# Patient Record
Sex: Male | Born: 1985 | Race: Black or African American | Hispanic: No | Marital: Single
Health system: Southern US, Community
[De-identification: ages and names within clinical notes are randomized; demographics above are authoritative.]

## PROBLEM LIST (undated history)

## (undated) DIAGNOSIS — Z201 Contact with and (suspected) exposure to tuberculosis: Secondary | ICD-10-CM

---

## 2007-08-27 DIAGNOSIS — Z201 Contact with and (suspected) exposure to tuberculosis: Secondary | ICD-10-CM

## 2007-08-27 HISTORY — DX: Contact with and (suspected) exposure to tuberculosis: Z20.1

## 2012-07-04 ENCOUNTER — Emergency Department (HOSPITAL_COMMUNITY)
Admission: EM | Admit: 2012-07-04 | Discharge: 2012-07-04 | Disposition: A | Discharge status: Home / Self Care | Payer: Self-pay | Attending: Emergency Medicine | Admitting: Emergency Medicine

## 2012-07-04 ENCOUNTER — Encounter (HOSPITAL_COMMUNITY): Payer: Self-pay | Admitting: Physical Medicine and Rehabilitation

## 2012-07-04 DIAGNOSIS — L0231 Cutaneous abscess of buttock: Secondary | ICD-10-CM | POA: Insufficient documentation

## 2012-07-04 DIAGNOSIS — L0291 Cutaneous abscess, unspecified: Secondary | ICD-10-CM

## 2012-07-04 DIAGNOSIS — L03317 Cellulitis of buttock: Secondary | ICD-10-CM | POA: Insufficient documentation

## 2012-07-04 MED ORDER — HYDROCODONE-ACETAMINOPHEN 5-500 MG PO TABS: 1.0000 | ORAL_TABLET | Freq: Four times a day (QID) | ORAL | Status: DC | PRN

## 2012-07-04 MED ORDER — SULFAMETHOXAZOLE-TRIMETHOPRIM 800-160 MG PO TABS: 1.0000 | ORAL_TABLET | Freq: Two times a day (BID) | ORAL | Status: DC

## 2012-07-04 NOTE — ED Notes (Signed)
I and D completed by MD pt tolerated well. Dressing with instructions given

## 2012-07-04 NOTE — ED Notes (Signed)
Pt presents to department for evaluation of possible abscess to R buttock. Ongoing x4 days. Red, raised area with yellow drainage noted. Pt states 8/10 pain. Denies fever. He is alert and oriented x4. No signs of distress noted.

## 2012-07-04 NOTE — ED Notes (Signed)
MD at bedside. 

## 2012-07-04 NOTE — ED Provider Notes (Addendum)
History    This chart was scribed for Gwyneth Sprout, MD, MD by Smitty Pluck, ED Scribe. The patient was seen in room [facility] and the patient's care was started at 2:18PM.   CSN: [id number]  Arrival date & time 07/04/12  1407      No chief complaint on file.   (Consider location/radiation/quality/duration/timing/severity/associated sxs/prior treatment) The history is provided by the patient. No language interpreter was used.   [name] is a 26 y.o. male who presents to the Emergency Department complaining of constant, moderate right buttocks pain due to abscess onset 4 days ago. Pt reports that he has had yellow drainage, redness and pain is rated at 8/10.Marland Kitchen He denies fever and any other pain.   No past medical history on file.  No past surgical history on file.  No family history on file.  History  Substance Use Topics  . Smoking status: Not on file  . Smokeless tobacco: Not on file  . Alcohol Use: Not on file      Review of Systems  Constitutional: Negative for fever and chills.  Respiratory: Negative for shortness of breath.   Gastrointestinal: Negative for nausea and vomiting.  Neurological: Negative for weakness.  All other systems reviewed and are negative.    Allergies  Review of patient's allergies indicates not on file.  Home Medications  No current outpatient prescriptions on file.  BP 128/74  Pulse 112  Temp 99.7 F (37.6 C) (Oral)  Resp 18  SpO2 97%  Physical Exam  Nursing note and vitals reviewed. Constitutional: He is oriented to person, place, and time. He appears well-developed and well-nourished. No distress.  HENT:  Head: Normocephalic and atraumatic.  Eyes: EOM are normal.  Neck: Neck supple. No tracheal deviation present.  Pulmonary/Chest: Effort normal. No respiratory distress.  Musculoskeletal: Normal range of motion.  Neurological: He is alert and oriented to person, place, and time.  Skin: Skin is warm and dry.   fluctuant pointing abscess on right buttocks Surrounding erythema and induration Not currently draining Warm   Psychiatric: He has a normal mood and affect. His behavior is normal.    ED Course  Procedures (including critical care time) DIAGNOSTIC STUDIES: Oxygen Saturation is 97% on room air, normal by my interpretation.    COORDINATION OF CARE: 2:20 PM Discussed ED treatment with pt     Labs Reviewed - No data to display No results found.  INCISION AND DRAINAGE Performed by: Gwyneth Sprout Consent: Verbal consent obtained. Risks and benefits: risks, benefits and alternatives were discussed Type: abscess  Body area: right buttocks  Anesthesia: local infiltration  Local anesthetic: lidocaine 2% with epinephrine  Anesthetic total: 5 ml  Complexity: complex, incision was made with 11blade scaple Blunt dissection to break up loculations  Drainage: purulent  Drainage amount: 5mL  Packing material: none  Patient tolerance: Patient tolerated the procedure well with no immediate complications.     1. Abscess       MDM   Patient with an abscess and surrounding cellulitis. I&D as above patient placed on Bactrim and discharged home.      *I personally performed the services described in this documentation, which was scribed in my presence.  The recorded information has been reviewed and considered.    Gwyneth Sprout, MD 07/04/12 1438  Gwyneth Sprout, MD 07/15/12 1326

## 2013-03-26 ENCOUNTER — Emergency Department (HOSPITAL_COMMUNITY)
Admission: EM | Admit: 2013-03-26 | Discharge: 2013-03-26 | Disposition: A | Discharge status: Home / Self Care | Payer: Self-pay | Attending: Emergency Medicine | Admitting: Emergency Medicine

## 2013-03-26 ENCOUNTER — Encounter (HOSPITAL_COMMUNITY): Payer: Self-pay | Admitting: *Deleted

## 2013-03-26 DIAGNOSIS — L0291 Cutaneous abscess, unspecified: Secondary | ICD-10-CM

## 2013-03-26 DIAGNOSIS — Z8611 Personal history of tuberculosis: Secondary | ICD-10-CM | POA: Insufficient documentation

## 2013-03-26 DIAGNOSIS — IMO0002 Reserved for concepts with insufficient information to code with codable children: Secondary | ICD-10-CM | POA: Insufficient documentation

## 2013-03-26 DIAGNOSIS — F17200 Nicotine dependence, unspecified, uncomplicated: Secondary | ICD-10-CM | POA: Insufficient documentation

## 2013-03-26 HISTORY — DX: Contact with and (suspected) exposure to tuberculosis: Z20.1

## 2013-03-26 MED ORDER — IBUPROFEN 800 MG PO TABS
800.0000 mg | ORAL_TABLET | Freq: Once | ORAL | Status: AC
  Administered 2013-03-26: 800 mg via ORAL
  Filled 2013-03-26: qty 1

## 2013-03-26 NOTE — ED Provider Notes (Signed)
CSN: [id number]     Arrival date & time 03/26/13  1027 History     First MD Initiated Contact with Patient 03/26/13 [phone]     Chief Complaint  Patient presents with  . Abscess    L arm   (Consider location/radiation/quality/duration/timing/severity/associated sxs/prior Treatment) HPI Comments: Patient is a 27 year old otherwise healthy male presenting to the ED for a abscess on his left forearm that began 4 days ago. Patient states area is moderately tender without radiation. Patient has not noticed any drainage from site, surrounding erythema or warmth. Patient states he has had abscesses in the past that required drainage. He denies any fevers, chills.   Patient is a 27 y.o. male presenting with abscess.  Abscess Associated symptoms: no fever     Past Medical History  Diagnosis Date  . TB (tuberculosis) contact 2009    skin test positive finished tx   History reviewed. No pertinent past surgical history. No family history on file. History  Substance Use Topics  . Smoking status: Current Every Day Smoker -- 0.50 packs/day    Types: Cigarettes  . Smokeless tobacco: Not on file  . Alcohol Use: Yes    Review of Systems  Constitutional: Negative for fever and chills.  Skin:       Abscess    Allergies  Review of patient's allergies indicates no known allergies.  Home Medications   Current Outpatient Rx  Name  Route  Sig  Dispense  Refill  . HYDROcodone-acetaminophen (VICODIN) 5-500 MG per tablet   Oral   Take 1-2 tablets by mouth every 6 (six) hours as needed for pain.   15 tablet   0   . ibuprofen (ADVIL,MOTRIN) 200 MG tablet   Oral   Take 600 mg by mouth every 6 (six) hours as needed. For pain         . sulfamethoxazole-trimethoprim (SEPTRA DS) 800-160 MG per tablet   Oral   Take 1 tablet by mouth every 12 (twelve) hours.   10 tablet   0    BP 137/87  Pulse 62  Temp(Src) 98.1 F (36.7 C) (Oral)  Resp 18  SpO2 99% Physical Exam  Constitutional: He  is oriented to person, place, and time. He appears well-developed and well-nourished. No distress.  HENT:  Head: Normocephalic and atraumatic.  Eyes: Conjunctivae are normal.  Neck: Neck supple.  Pulmonary/Chest: Effort normal.  Abdominal: Soft.  Musculoskeletal: Normal range of motion.  Neurological: He is alert and oriented to person, place, and time.  Skin: Skin is warm and dry. He is not diaphoretic.  2 cm fluctuant abscess on left forearm. No surrounding erythema or warmth.   Psychiatric: He has a normal mood and affect.    ED Course   Procedures (including critical care time)  INCISION AND DRAINAGE Performed by: Francee Piccolo L Consent: Verbal consent obtained. Risks and benefits: risks, benefits and alternatives were discussed Type: abscess  Body area: left forearm  Anesthesia: local infiltration  Incision was made with a scalpel.  Local anesthetic: lidocaine 2% w/o epinephrine  Anesthetic total: 4 ml  Complexity: complex Blunt dissection to break up loculations  Drainage: purulent  Drainage amount: copious  Packing material: 1/4 in iodoform gauze  Patient tolerance: Patient tolerated the procedure well with no immediate complications.     Labs Reviewed - No data to display No results found. 1. Abscess     MDM  Patient with skin abscess amenable to incision and drainage.  Abscess was packed,  wound recheck in 2 days. Encouraged home warm soaks and flushing.  No signs of cellulitis is surrounding skin.  Will d/c to home.  No antibiotic therapy is indicated. Patient is agreeable to plan. Patient is stable at time of discharge     Jeannetta Ellis, PA-C 03/26/13 1600

## 2013-03-26 NOTE — ED Notes (Signed)
Pt reports shaving his L arm x 4 days ago to have a tattoo done.  Started to notice a boil.  Pt presents with a large abscess in his L arm.  Pt reports pain in his L arm.

## 2013-03-26 NOTE — Progress Notes (Signed)
[facility] did not get to see patient but will be sending information about the [facility] Orange Card program, using the address provided.  °

## 2013-03-28 NOTE — ED Provider Notes (Signed)
Medical screening examination/treatment/procedure(s) were performed by non-physician practitioner and as supervising physician I was immediately available for consultation/collaboration.   Suzi Roots, MD 03/28/13 [phone]

## 2013-12-04 ENCOUNTER — Encounter (HOSPITAL_COMMUNITY): Payer: Self-pay | Admitting: Emergency Medicine

## 2013-12-04 ENCOUNTER — Emergency Department (HOSPITAL_COMMUNITY): Payer: Self-pay

## 2013-12-04 ENCOUNTER — Emergency Department (HOSPITAL_COMMUNITY)
Admission: EM | Admit: 2013-12-04 | Discharge: 2013-12-04 | Disposition: A | Discharge status: Home / Self Care | Payer: Self-pay | Attending: Emergency Medicine | Admitting: Emergency Medicine

## 2013-12-04 DIAGNOSIS — Z8611 Personal history of tuberculosis: Secondary | ICD-10-CM | POA: Insufficient documentation

## 2013-12-04 DIAGNOSIS — F17200 Nicotine dependence, unspecified, uncomplicated: Secondary | ICD-10-CM | POA: Insufficient documentation

## 2013-12-04 DIAGNOSIS — M549 Dorsalgia, unspecified: Secondary | ICD-10-CM | POA: Insufficient documentation

## 2013-12-04 DIAGNOSIS — B349 Viral infection, unspecified: Secondary | ICD-10-CM

## 2013-12-04 DIAGNOSIS — B9789 Other viral agents as the cause of diseases classified elsewhere: Secondary | ICD-10-CM | POA: Insufficient documentation

## 2013-12-04 DIAGNOSIS — D72829 Elevated white blood cell count, unspecified: Secondary | ICD-10-CM | POA: Insufficient documentation

## 2013-12-04 LAB — HIV ANTIBODY (ROUTINE TESTING W REFLEX): HIV 1&2 Ab, 4th Generation: NONREACTIVE

## 2013-12-04 LAB — CBC WITH DIFFERENTIAL/PLATELET
BASOS ABS: 0 10*3/uL (ref 0.0–0.1)
BASOS PCT: 0 % (ref 0–1)
EOS PCT: 0 % (ref 0–5)
Eosinophils Absolute: 0 10*3/uL (ref 0.0–0.7)
HEMATOCRIT: 45.9 % (ref 39.0–52.0)
Hemoglobin: 16.3 g/dL (ref 13.0–17.0)
Lymphocytes Relative: 7 % — ABNORMAL LOW (ref 12–46)
Lymphs Abs: 0.9 10*3/uL (ref 0.7–4.0)
MCH: 31.6 pg (ref 26.0–34.0)
MCHC: 35.5 g/dL (ref 30.0–36.0)
MCV: 89 fL (ref 78.0–100.0)
MONO ABS: 1.8 10*3/uL — AB (ref 0.1–1.0)
Monocytes Relative: 14 % — ABNORMAL HIGH (ref 3–12)
Neutro Abs: 9.8 10*3/uL — ABNORMAL HIGH (ref 1.7–7.7)
Neutrophils Relative %: 79 % — ABNORMAL HIGH (ref 43–77)
PLATELETS: 228 10*3/uL (ref 150–400)
RBC: 5.16 MIL/uL (ref 4.22–5.81)
RDW: 12.7 % (ref 11.5–15.5)
WBC: 12.5 10*3/uL — ABNORMAL HIGH (ref 4.0–10.5)

## 2013-12-04 LAB — BASIC METABOLIC PANEL
BUN: 8 mg/dL (ref 6–23)
CALCIUM: 9.6 mg/dL (ref 8.4–10.5)
CO2: 22 mEq/L (ref 19–32)
CREATININE: 0.84 mg/dL (ref 0.50–1.35)
Chloride: 100 mEq/L (ref 96–112)
Glucose, Bld: 113 mg/dL — ABNORMAL HIGH (ref 70–99)
Potassium: 4 mEq/L (ref 3.7–5.3)
Sodium: 134 mEq/L — ABNORMAL LOW (ref 137–147)

## 2013-12-04 LAB — URINALYSIS, ROUTINE W REFLEX MICROSCOPIC
Bilirubin Urine: NEGATIVE
Glucose, UA: NEGATIVE mg/dL
Hgb urine dipstick: NEGATIVE
KETONES UR: 15 mg/dL — AB
LEUKOCYTES UA: NEGATIVE
NITRITE: NEGATIVE
PROTEIN: 30 mg/dL — AB
Specific Gravity, Urine: 1.03 (ref 1.005–1.030)
UROBILINOGEN UA: 1 mg/dL (ref 0.0–1.0)
pH: 6 (ref 5.0–8.0)

## 2013-12-04 LAB — URINE MICROSCOPIC-ADD ON

## 2013-12-04 LAB — RPR

## 2013-12-04 MED ORDER — PROMETHAZINE HCL 25 MG PO TABS: 25.0000 mg | ORAL_TABLET | Freq: Four times a day (QID) | ORAL | Status: DC | PRN

## 2013-12-04 MED ORDER — ONDANSETRON HCL 4 MG/2ML IJ SOLN
4.0000 mg | Freq: Once | INTRAMUSCULAR | Status: AC
Start: 2013-12-04 — End: 2013-12-04
  Administered 2013-12-04: 4 mg via INTRAVENOUS
  Filled 2013-12-04: qty 2

## 2013-12-04 MED ORDER — SODIUM CHLORIDE 0.9 % IV BOLUS (SEPSIS): 1000.0000 mL | Freq: Once | INTRAVENOUS | Status: DC

## 2013-12-04 MED ORDER — METRONIDAZOLE 500 MG PO TABS
2000.0000 mg | ORAL_TABLET | Freq: Once | ORAL | Status: AC
  Administered 2013-12-04: 2000 mg via ORAL
  Filled 2013-12-04: qty 4

## 2013-12-04 MED ORDER — ACETAMINOPHEN 500 MG PO TABS
1000.0000 mg | ORAL_TABLET | Freq: Once | ORAL | Status: AC
  Administered 2013-12-04: 1000 mg via ORAL
  Filled 2013-12-04: qty 2

## 2013-12-04 MED ORDER — HYDROCODONE-ACETAMINOPHEN 5-325 MG PO TABS: 1.0000 | ORAL_TABLET | ORAL | Status: DC | PRN

## 2013-12-04 MED ORDER — IBUPROFEN 800 MG PO TABS: 800.0000 mg | ORAL_TABLET | Freq: Three times a day (TID) | ORAL | Status: DC | PRN

## 2013-12-04 MED ORDER — KETOROLAC TROMETHAMINE 30 MG/ML IJ SOLN
30.0000 mg | Freq: Once | INTRAMUSCULAR | Status: AC
  Administered 2013-12-04: 30 mg via INTRAVENOUS
  Filled 2013-12-04: qty 1

## 2013-12-04 MED ORDER — SODIUM CHLORIDE 0.9 % IV BOLUS (SEPSIS)
1000.0000 mL | Freq: Once | INTRAVENOUS | Status: AC
  Administered 2013-12-04: 1000 mL via INTRAVENOUS

## 2013-12-04 NOTE — ED Notes (Signed)
He tells me he has had generalized myalgias, plus some n/v/d; x 2-3 days.

## 2013-12-04 NOTE — ED Notes (Signed)
Pt now stating wants STD check b/c male partner has trich.

## 2013-12-04 NOTE — Discharge Instructions (Signed)
Viral Infections A viral infection can be caused by different types of viruses.Most viral infections are not serious and resolve on their own. However, some infections may cause severe symptoms and may lead to further complications. SYMPTOMS Viruses can frequently cause:  Minor sore throat.  Aches and pains.  Headaches.  Runny nose.  Different types of rashes.  Watery eyes.  Tiredness.  Cough.  Loss of appetite.  Gastrointestinal infections, resulting in nausea, vomiting, and diarrhea. These symptoms do not respond to antibiotics because the infection is not caused by bacteria. However, you might catch a bacterial infection following the viral infection. This is sometimes called a "superinfection." Symptoms of such a bacterial infection may include:  Worsening sore throat with pus and difficulty swallowing.  Swollen neck glands.  Chills and a high or persistent fever.  Severe headache.  Tenderness over the sinuses.  Persistent overall ill feeling (malaise), muscle aches, and tiredness (fatigue).  Persistent cough.  Yellow, green, or brown mucus production with coughing. HOME CARE INSTRUCTIONS   Only take over-the-counter or prescription medicines for pain, discomfort, diarrhea, or fever as directed by your caregiver.  Drink enough water and fluids to keep your urine clear or pale yellow. Sports drinks can provide valuable electrolytes, sugars, and hydration.  Get plenty of rest and maintain proper nutrition. Soups and broths with crackers or rice are fine. SEEK IMMEDIATE MEDICAL CARE IF:   You have severe headaches, shortness of breath, chest pain, neck pain, or an unusual rash.  You have uncontrolled vomiting, diarrhea, or you are unable to keep down fluids.  You or your child has an oral temperature above 102 F (38.9 C), not controlled by medicine.  Your baby is older than 3 months with a rectal temperature of 102 F (38.9 C) or higher.  Your baby is 13  months old or younger with a rectal temperature of 100.4 F (38 C) or higher. MAKE SURE YOU:   Understand these instructions.  Will watch your condition.  Will get help right away if you are not doing well or get worse. Document Released: 05/22/2005 Document Revised: 11/04/2011 Document Reviewed: 12/17/2010 [facility] Patient Information 2014 [place], [place]. Possible Influenza, Adult Influenza ("the flu") is a viral infection of the respiratory tract. It occurs more often in winter months because people spend more time in close contact with one another. Influenza can make you feel very sick. Influenza easily spreads from person to person (contagious). CAUSES  Influenza is caused by a virus that infects the respiratory tract. You can catch the virus by breathing in droplets from an infected person's cough or sneeze. You can also catch the virus by touching something that was recently contaminated with the virus and then touching your mouth, nose, or eyes. SYMPTOMS  Symptoms typically last 4 to 10 days and may include: Fever. Chills. Headache, body aches, and muscle aches. Sore throat. Chest discomfort and cough. Poor appetite. Weakness or feeling tired. Dizziness. Nausea or vomiting. DIAGNOSIS  Diagnosis of influenza is often made based on your history and a physical exam. A nose or throat swab test can be done to confirm the diagnosis. RISKS AND COMPLICATIONS You may be at risk for a more severe case of influenza if you smoke cigarettes, have diabetes, have chronic heart disease (such as heart failure) or lung disease (such as asthma), or if you have a weakened immune system. Elderly people and pregnant women are also at risk for more serious infections. The most common complication of influenza is a  lung infection (pneumonia). Sometimes, this complication can require emergency medical care and may be life-threatening. PREVENTION  An annual influenza vaccination (flu shot) is the best  way to avoid getting influenza. An annual flu shot is now routinely recommended for all adults in the [place] TREATMENT  In mild cases, influenza goes away on its own. Treatment is directed at relieving symptoms. For more severe cases, your caregiver may prescribe antiviral medicines to shorten the sickness. Antibiotic medicines are not effective, because the infection is caused by a virus, not by bacteria. HOME CARE INSTRUCTIONS Only take over-the-counter or prescription medicines for pain, discomfort, or fever as directed by your caregiver. Use a cool mist humidifier to make breathing easier. Get plenty of rest until your temperature returns to normal. This usually takes 3 to 4 days. Drink enough fluids to keep your urine clear or pale yellow. Cover your mouth and nose when coughing or sneezing, and wash your hands well to avoid spreading the virus. Stay home from work or school until your fever has been gone for at least 1 full day. SEEK MEDICAL CARE IF:  You have chest pain or a deep cough that worsens or produces more mucus. You have nausea, vomiting, or diarrhea. SEEK IMMEDIATE MEDICAL CARE IF:  You have difficulty breathing, shortness of breath, or your skin or nails turn bluish. You have severe neck pain or stiffness. You have a severe headache, facial pain, or earache. You have a worsening or recurring fever. You have nausea or vomiting that cannot be controlled. MAKE SURE YOU: Understand these instructions. Will watch your condition. Will get help right away if you are not doing well or get worse. Document Released: 08/09/2000 Document Revised: 02/11/2012 Document Reviewed: 11/11/2011 [facility] Patient Information 2014 [place], [place].

## 2013-12-04 NOTE — ED Provider Notes (Signed)
TIME SEEN: 11:50 AM  CHIEF COMPLAINT: Fever, body aches, cough, congestion, STD exposure  HPI: Patient is a 28 year old male with no significant past medical history who presents emergency department with 3-4 days of fevers, chills, body aches, headache, cough, congestion, diarrhea, vomiting. He states he is also concerned because his girlfriend was recently tested for STDs and was positive for trichomonas. He denies dysuria, hematuria, penile discharge, testicular pain or swelling. No sick contacts or recent travel or hospitalization. He is not immunocompromised. No neck pain or neck stiffness. No chest pain or shortness of breath. No abdominal pain. No rash.  ROS: See HPI Constitutional: no fever  Eyes: no drainage  ENT: no runny nose   Cardiovascular:  no chest pain  Resp: no SOB  GI: no vomiting GU: no dysuria Integumentary: no rash  Allergy: no hives  Musculoskeletal: no leg swelling  Neurological: no slurred speech ROS otherwise negative  PAST MEDICAL HISTORY/PAST SURGICAL HISTORY:  Past Medical History  Diagnosis Date  . TB (tuberculosis) contact 2009    skin test positive finished tx    MEDICATIONS:  Prior to Admission medications   Not on File    ALLERGIES:  No Known Allergies  SOCIAL HISTORY:  History  Substance Use Topics  . Smoking status: Current Every Day Smoker -- 0.50 packs/day    Types: Cigarettes  . Smokeless tobacco: Not on file  . Alcohol Use: Yes    FAMILY HISTORY: History reviewed. No pertinent family history.  EXAM: BP 141/79  Pulse 91  Temp(Src) 100.3 F (37.9 C) (Oral)  Resp 16  SpO2 95% CONSTITUTIONAL: Alert and oriented and responds appropriately to questions. Appears uncomfortable but is nontoxic, well-hydrated HEAD: Normocephalic EYES: Conjunctivae clear, PERRL ENT: normal nose; no rhinorrhea; moist mucous membranes; pharynx without lesions noted NECK: Supple, no meningismus, no LAD  CARD: RRR; S1 and S2 appreciated; no murmurs,  no clicks, no rubs, no gallops RESP: Normal chest excursion without splinting or tachypnea; breath sounds clear and equal bilaterally; no wheezes, no rhonchi, no rales, patient is frequently coughing ABD/GI: Normal bowel sounds; non-distended; soft, non-tender, no rebound, no guarding BACK:  The back appears normal and is non-tender to palpation, there is no CVA tenderness EXT: Normal ROM in all joints; non-tender to palpation; no edema; normal capillary refill; no cyanosis    SKIN: Normal color for age and race; warm NEURO: Moves all extremities equally, strength 5/5 upper extremity, normal gait, sensation to light touch intact diffusely, 2+ deep tendon reflexes in bilateral upper lower extremity is. PSYCH: The patient's mood and manner are appropriate. Grooming and personal hygiene are appropriate.  MEDICAL DECISION MAKING: Patient likely with viral illness.  He is well-appearing with a reassuring exam. Will give IV fluids, Zofran, Toradol. We'll check basic labs, urine. Will also check for STDs.  ED PROGRESS: Mild leukocytosis with left shift. His urine shows no sign of infection, no trichomonas but given his recent exposure, will treat with Flagyl. Chest x-ray is negative. STD screening is pending. He has been able to tolerate by mouth without any further vomiting. We'll discharge him with return precautions and supportive care instructions.  Patient is asking for something stronger for pain as he is also having some mild back pain. On exam he has no midline spinal tenderness or step-off or deformity, no complaints of numbness, tingling incontinence. His neurologic exam is normal. Doubt transverse myelitis, cauda equina, discitis, epidural abscess.     Layla MawKristen N Sherryann Frese, DO 12/04/13 1409

## 2013-12-04 NOTE — ED Notes (Signed)
Pt states fever, chills, body aches and headache x 3-4 days.  Unable to check fever at home.  Vomiting x 1

## 2013-12-04 NOTE — ED Notes (Signed)
Pt in X ray

## 2015-04-30 IMAGING — CR DG CHEST 2V
2 series · 2 of 2 positions shown · non-contrast
Comparison: None.

CLINICAL DATA: Chest pain, cough, fever, history of smoking,
evaluate for pneumonia

EXAM:
CHEST  2 VIEW

[w chest pa]
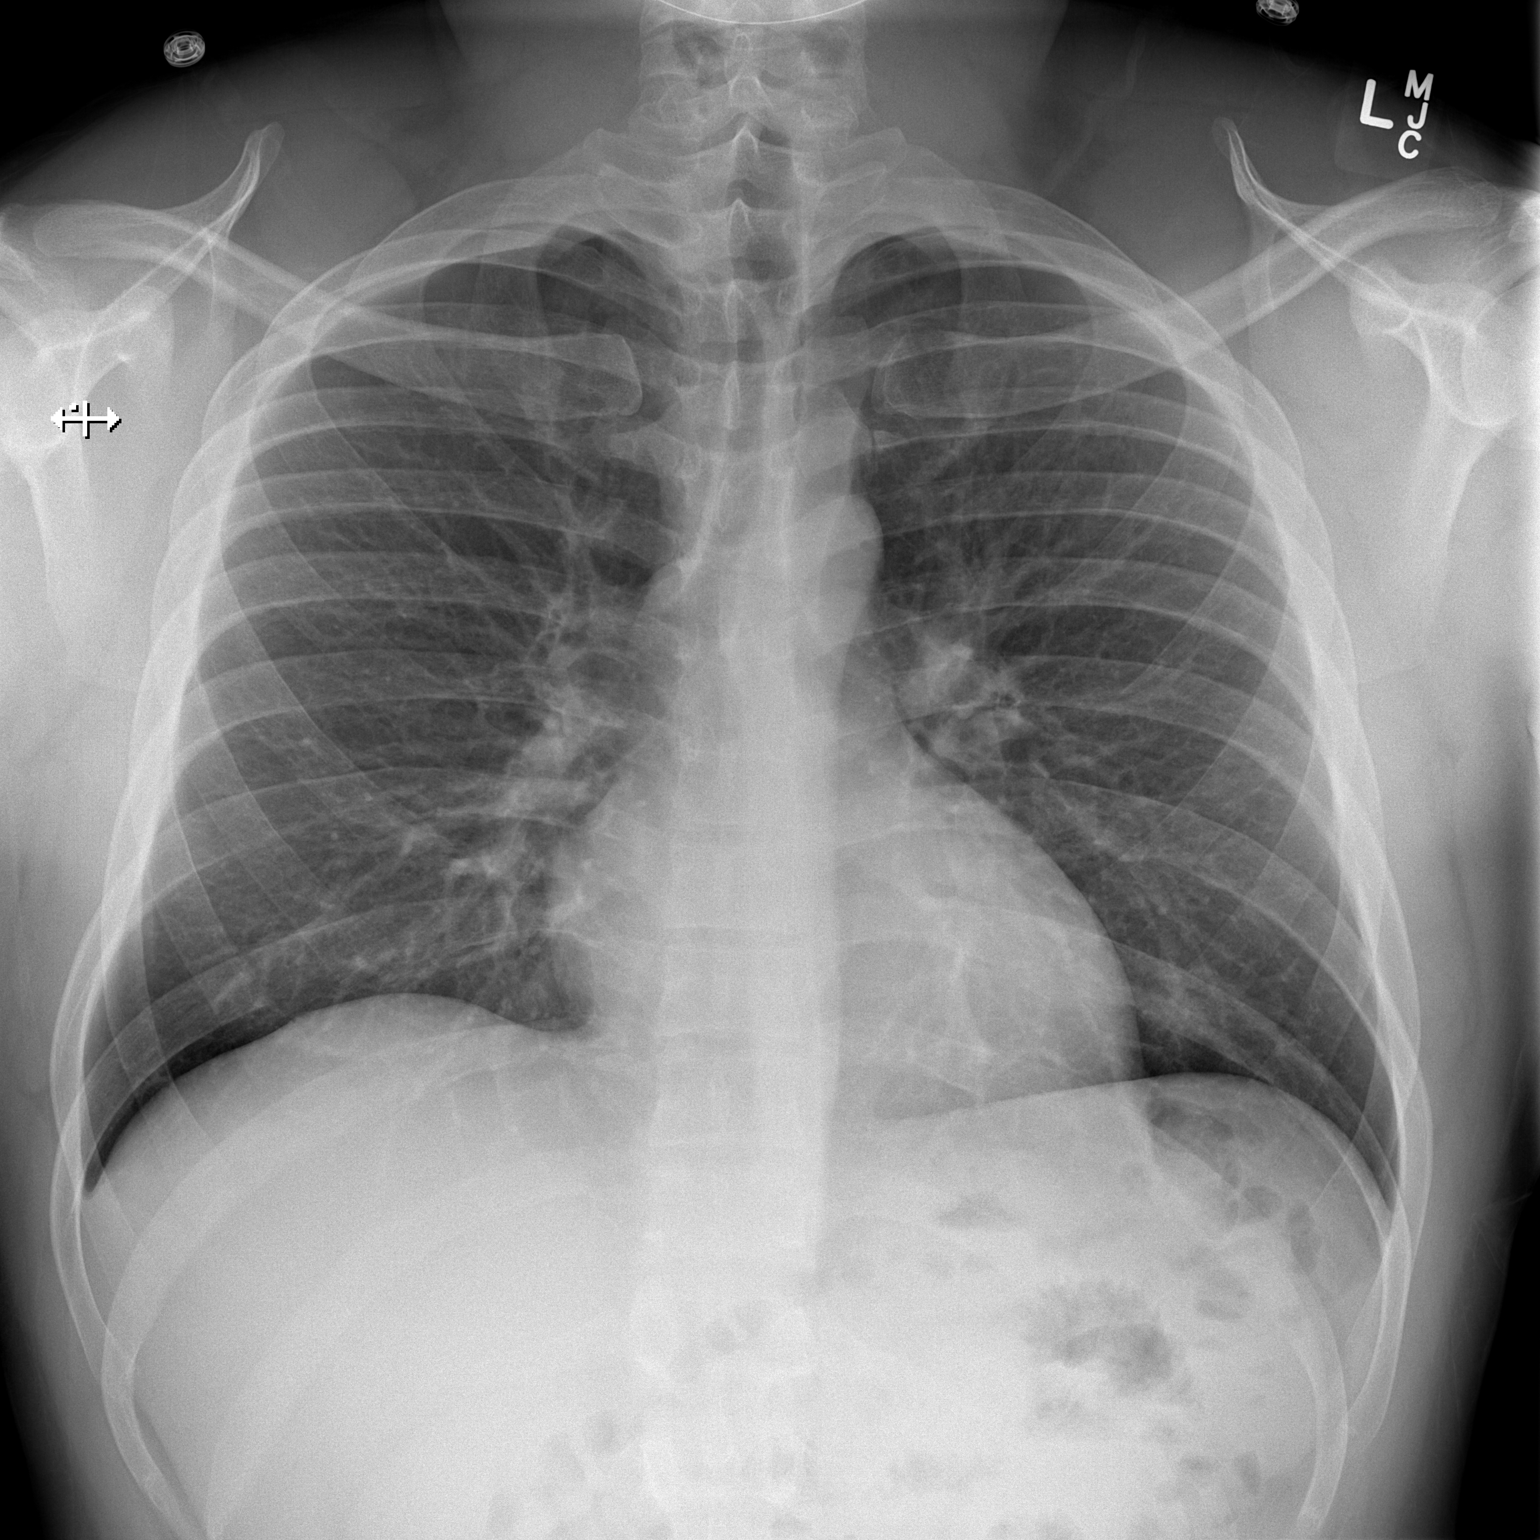

[w chest lat]
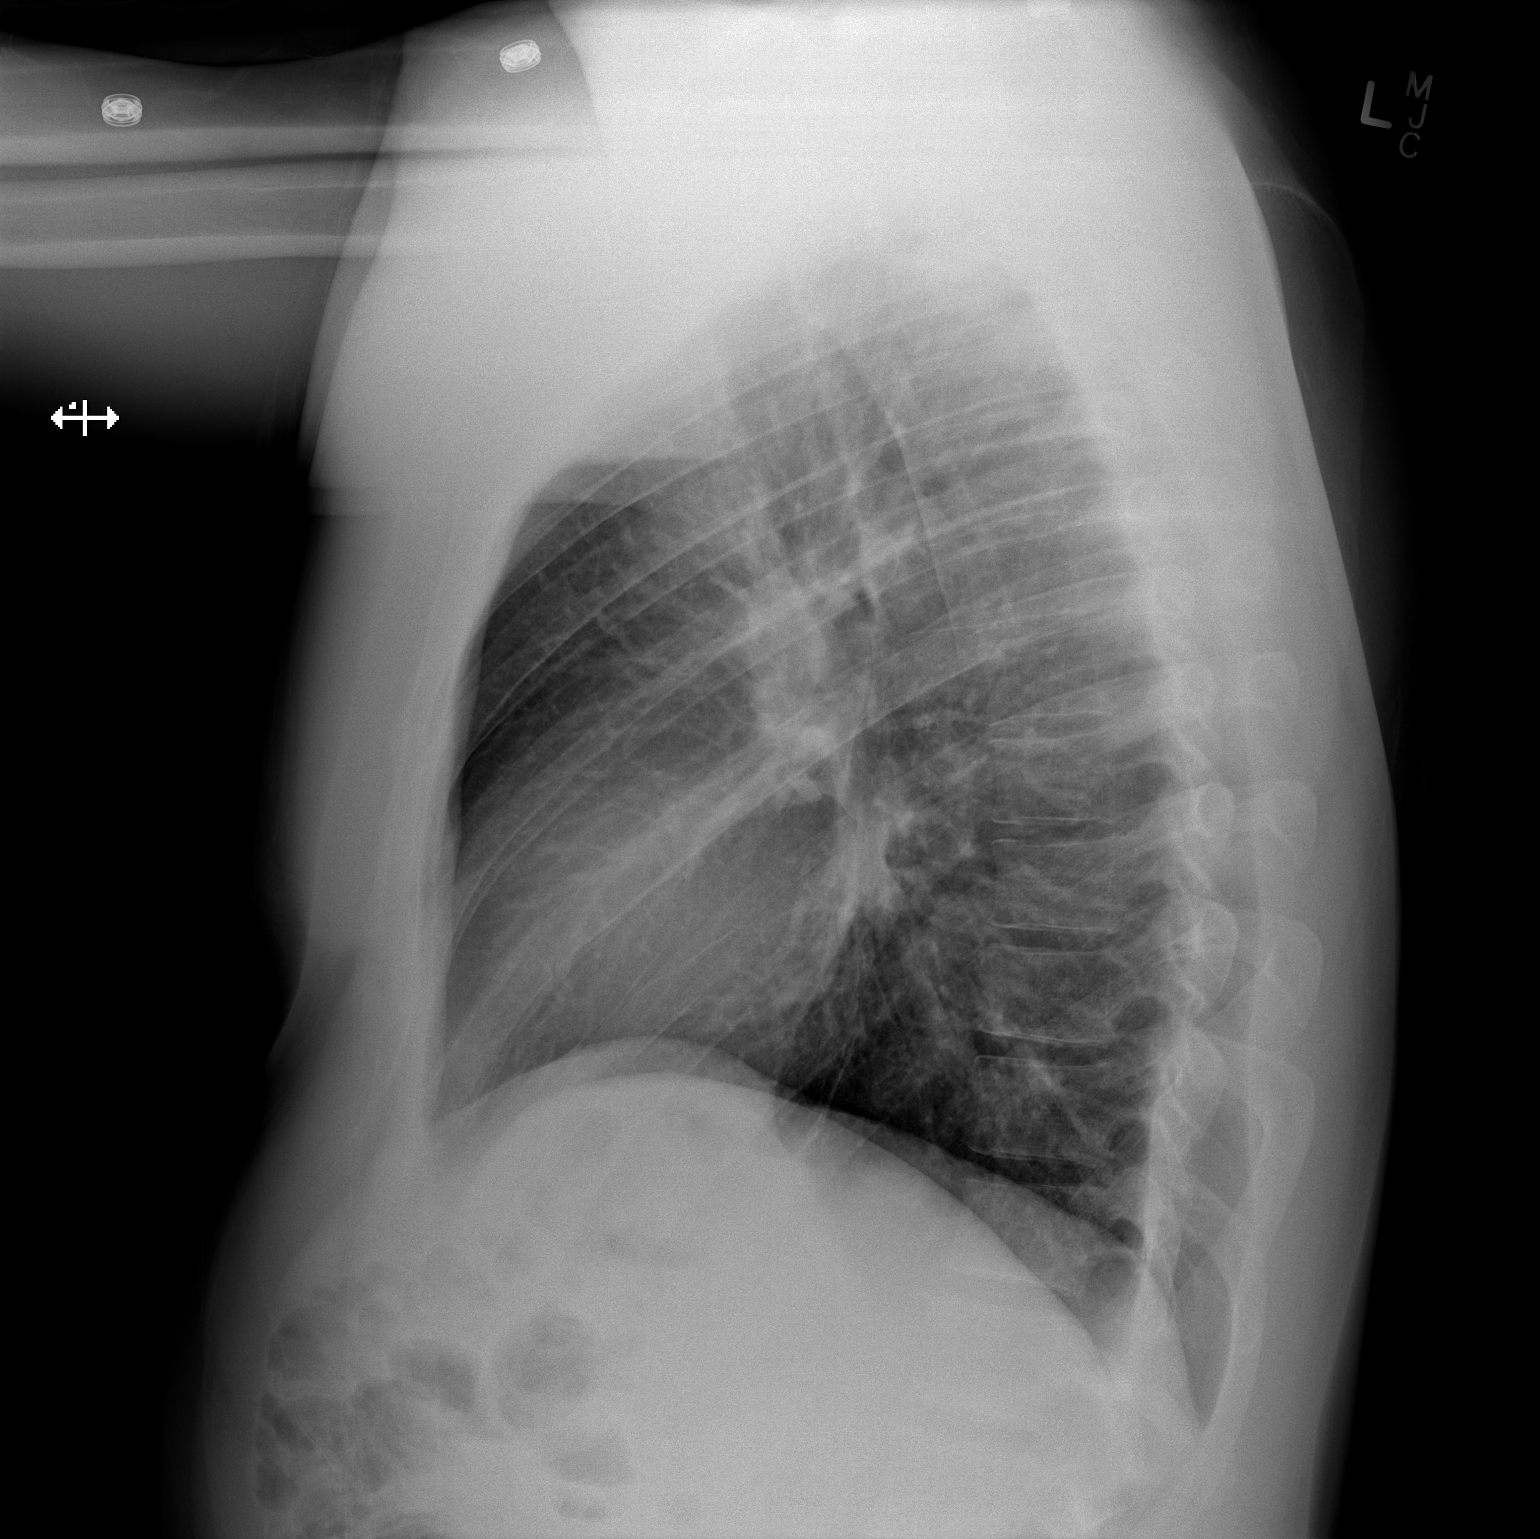

[2 of 2 positions shown; findings below may reference images not displayed]

FINDINGS: Normal cardiac silhouette and mediastinal contours. The lungs are
mildly hyperexpanded. No focal airspace opacities. No pleural
effusion or pneumothorax. No evidence of edema. No acute osseus
abnormalities.
IMPRESSION: Mild lung hyperexpansion without acute cardiopulmonary disease.
Specifically, no evidence of pneumonia.

## 2016-03-01 ENCOUNTER — Encounter (HOSPITAL_COMMUNITY): Payer: Self-pay | Admitting: Emergency Medicine

## 2016-03-01 ENCOUNTER — Emergency Department (HOSPITAL_COMMUNITY)
Admission: EM | Admit: 2016-03-01 | Discharge: 2016-03-01 | Disposition: A | Discharge status: Home / Self Care | Payer: Self-pay | Attending: Emergency Medicine | Admitting: Emergency Medicine

## 2016-03-01 DIAGNOSIS — L259 Unspecified contact dermatitis, unspecified cause: Secondary | ICD-10-CM

## 2016-03-01 DIAGNOSIS — F17210 Nicotine dependence, cigarettes, uncomplicated: Secondary | ICD-10-CM | POA: Insufficient documentation

## 2016-03-01 DIAGNOSIS — L255 Unspecified contact dermatitis due to plants, except food: Secondary | ICD-10-CM | POA: Insufficient documentation

## 2016-03-01 MED ORDER — PREDNISONE 20 MG PO TABS
60.0000 mg | ORAL_TABLET | Freq: Once | ORAL | Status: AC
  Administered 2016-03-01: 60 mg via ORAL
  Filled 2016-03-01: qty 3

## 2016-03-01 MED ORDER — PREDNISONE 10 MG PO TABS: ORAL_TABLET | ORAL | Status: AC

## 2016-03-01 NOTE — ED Provider Notes (Signed)
CSN: [id number]     Arrival date & time 03/01/16  [id number] History   First MD Initiated Contact with Patient 03/01/16 0820     Chief Complaint  Patient presents with  . Rash     (Consider location/radiation/quality/duration/timing/severity/associated sxs/prior Treatment) HPI [name] is a 30 y.o. male with no medical problems presents to emergency department with a rash. Patient states rash started approximately 3 weeks ago when he started a new job. Patient works in Aeronautical engineerlandscaping. He states rashes to bilateral hands, forearms, upper arms, neck. Denies any rash to any other parts of the body. No rash involving oral mucosa. No fever or chills. He states he had similar rash in the past when his entire family was diagnosed with scabies. He states this time however no one in family has had similar symptoms. He states he tried permethrin cream which did not help. He tried applying calamine lotion, taking Benadryl, doing a mild valgus, states none of these things have helped. No other associated symptoms.   Past Medical History  Diagnosis Date  . TB (tuberculosis) contact 2009    skin test positive finished tx   History reviewed. No pertinent past surgical history. No family history on file. Social History  Substance Use Topics  . Smoking status: Current Every Day Smoker -- 0.50 packs/day    Types: Cigarettes  . Smokeless tobacco: None  . Alcohol Use: Yes    Review of Systems  Constitutional: Negative for fever and chills.  Respiratory: Negative for cough, chest tightness and shortness of breath.   Cardiovascular: Negative for chest pain, palpitations and leg swelling.  Gastrointestinal: Negative for nausea, vomiting, abdominal pain, diarrhea and abdominal distention.  Genitourinary: Negative for dysuria, urgency, frequency and hematuria.  Musculoskeletal: Negative for myalgias, arthralgias, neck pain and neck stiffness.  Skin: Positive for rash.  Allergic/Immunologic: Negative for  immunocompromised state.  Neurological: Negative for dizziness, weakness, light-headedness, numbness and headaches.  All other systems reviewed and are negative.     Allergies  Review of patient's allergies indicates no known allergies.  Home Medications   Prior to Admission medications   Medication Sig Start Date End Date Taking? Authorizing Provider  HYDROcodone-acetaminophen (NORCO/VICODIN) 5-325 MG per tablet Take 1 tablet by mouth every 4 (four) hours as needed. 12/04/13   Kristen N Ward, DO  ibuprofen (ADVIL,MOTRIN) 800 MG tablet Take 1 tablet (800 mg total) by mouth every 8 (eight) hours as needed for mild pain. 12/04/13   Kristen N Ward, DO  promethazine (PHENERGAN) 25 MG tablet Take 1 tablet (25 mg total) by mouth every 6 (six) hours as needed for nausea or vomiting. 12/04/13   Kristen N Ward, DO   BP 140/74 mmHg  Pulse 65  Temp(Src) 98.6 F (37 C) (Oral)  Resp 18  SpO2 98% Physical Exam  Constitutional: He is oriented to person, place, and time. He appears well-developed and well-nourished. No distress.  HENT:  Head: Normocephalic and atraumatic.  No oral mucosal lesions  Eyes: Conjunctivae are normal.  Neck: Neck supple.  Cardiovascular: Normal rate, regular rhythm and normal heart sounds.   Pulmonary/Chest: Effort normal and breath sounds normal. No respiratory distress. He has no wheezes. He has no rales.  Musculoskeletal: He exhibits no edema.  Neurological: He is alert and oriented to person, place, and time.  Skin: Skin is warm and dry.  Erythematous papular rash to bilateral hands, forearms, upper arms, neck. No vesicular lesions. No drainage. Excoriations noted.  Nursing note and vitals reviewed.  ED Course  Procedures (including critical care time) Labs Review Labs Reviewed - No data to display  Imaging Review No results found. I have personally reviewed and evaluated these images and lab results as part of my medical decision-making.   EKG  Interpretation None      MDM   Final diagnoses:  Contact dermatitis    Pt with rash to bilateral arms and neck, since starting a landscaping job. Has tried benadryl, topical over the counter lotions with no relief. Will try prednisone taper. VS normal. No oral mucosal lesions. No SOB. No concern for scabies. Afebrile.   Filed Vitals:   03/01/16 0820  BP: 140/74  Pulse: 65  Temp: 98.6 F (37 C)  TempSrc: Oral  Resp: 18  SpO2: 98%       Jaynie Crumbleatyana Hollan Philipp, PA-C 03/01/16 0857  Linwood DibblesJon Knapp, MD 03/01/16 [phone]

## 2016-03-01 NOTE — ED Notes (Signed)
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 No itching anywhere else.

## 2016-03-01 NOTE — Discharge Instructions (Signed)
Take prednisone as prescribed until all gone. Next dose tomorrow. Follow up with your doctor. Wear protective clothing when working.    Contact Dermatitis Dermatitis is redness, soreness, and swelling (inflammation) of the skin. Contact dermatitis is a reaction to certain substances that touch the skin. There are two types of contact dermatitis:   Irritant contact dermatitis. This type is caused by something that irritates your skin, such as dry hands from washing them too much. This type does not require previous exposure to the substance for a reaction to occur. This type is more common.  Allergic contact dermatitis. This type is caused by a substance that you are allergic to, such as a nickel allergy or poison ivy. This type only occurs if you have been exposed to the substance (allergen) before. Upon a repeat exposure, your body reacts to the substance. This type is less common. CAUSES  Many different substances can cause contact dermatitis. Irritant contact dermatitis is most commonly caused by exposure to:   Makeup.   Soaps.   Detergents.   Bleaches.   Acids.   Metal salts, such as nickel.  Allergic contact dermatitis is most commonly caused by exposure to:   Poisonous plants.   Chemicals.   Jewelry.   Latex.   Medicines.   Preservatives in products, such as clothing.  RISK FACTORS This condition is more likely to develop in:   People who have jobs that expose them to irritants or allergens.  People who have certain medical conditions, such as asthma or eczema.  SYMPTOMS  Symptoms of this condition may occur anywhere on your body where the irritant has touched you or is touched by you. Symptoms include:  Dryness or flaking.   Redness.   Cracks.   Itching.   Pain or a burning feeling.   Blisters.  Drainage of small amounts of blood or clear fluid from skin cracks. With allergic contact dermatitis, there may also be swelling in areas  such as the eyelids, mouth, or genitals.  DIAGNOSIS  This condition is diagnosed with a medical history and physical exam. A patch skin test may be performed to help determine the cause. If the condition is related to your job, you may need to see an occupational medicine specialist. TREATMENT Treatment for this condition includes figuring out what caused the reaction and protecting your skin from further contact. Treatment may also include:   Steroid creams or ointments. Oral steroid medicines may be needed in more severe cases.  Antibiotics or antibacterial ointments, if a skin infection is present.  Antihistamine lotion or an antihistamine taken by mouth to ease itching.  A bandage (dressing). HOME CARE INSTRUCTIONS Skin Care  Moisturize your skin as needed.   Apply cool compresses to the affected areas.  Try taking a bath with:  Epsom salts. Follow the instructions on the packaging. You can get these at your local pharmacy or grocery store.  Baking soda. Pour a small amount into the bath as directed by your health care provider.  Colloidal oatmeal. Follow the instructions on the packaging. You can get this at your local pharmacy or grocery store.  Try applying baking soda paste to your skin. Stir water into baking soda until it reaches a paste-like consistency.  Do not scratch your skin.  Bathe less frequently, such as every other day.  Bathe in lukewarm water. Avoid using hot water. Medicines  Take or apply over-the-counter and prescription medicines only as told by your health care provider.   If  you were prescribed an antibiotic medicine, take or apply your antibiotic as told by your health care provider. Do not stop using the antibiotic even if your condition starts to improve. General Instructions  Keep all follow-up visits as told by your health care provider. This is important.  Avoid the substance that caused your reaction. If you do not know what caused  it, keep a journal to try to track what caused it. Write down:  What you eat.  What cosmetic products you use.  What you drink.  What you wear in the affected area. This includes jewelry.  If you were given a dressing, take care of it as told by your health care provider. This includes when to change and remove it. SEEK MEDICAL CARE IF:   Your condition does not improve with treatment.  Your condition gets worse.  You have signs of infection such as swelling, tenderness, redness, soreness, or warmth in the affected area.  You have a fever.  You have new symptoms. SEEK IMMEDIATE MEDICAL CARE IF:   You have a severe headache, neck pain, or neck stiffness.  You vomit.  You feel very sleepy.  You notice red streaks coming from the affected area.  Your bone or joint underneath the affected area becomes painful after the skin has healed.  The affected area turns darker.  You have difficulty breathing.   This information is not intended to replace advice given to you by your health care provider. Make sure you discuss any questions you have with your health care provider.   Document Released: 08/09/2000 Document Revised: 05/03/2015 Document Reviewed: 12/28/2014 Elsevier Interactive Patient Education [employer]2016 Elsevier Inc.

## 2019-11-16 HISTORY — PX: EXPLORATORY LAPAROTOMY: SUR591

## 2019-11-17 ENCOUNTER — Emergency Department (HOSPITAL_COMMUNITY): Payer: 59

## 2019-11-17 ENCOUNTER — Encounter (HOSPITAL_COMMUNITY): Payer: Self-pay | Admitting: *Deleted

## 2019-11-17 ENCOUNTER — Emergency Department (HOSPITAL_COMMUNITY): Payer: 59 | Admitting: Anesthesiology

## 2019-11-17 ENCOUNTER — Inpatient Hospital Stay (HOSPITAL_COMMUNITY): Payer: 59

## 2019-11-17 ENCOUNTER — Inpatient Hospital Stay (HOSPITAL_COMMUNITY)
Admission: EM | Admit: 2019-11-17 | Discharge: 2019-11-21 | DRG: 957 | Disposition: A | Discharge status: Home / Self Care | Payer: 59 | Attending: Surgery | Admitting: Surgery

## 2019-11-17 ENCOUNTER — Encounter (HOSPITAL_COMMUNITY): Admission: EM | Disposition: A | Discharge status: Home / Self Care | Payer: Self-pay | Source: Home / Self Care

## 2019-11-17 DIAGNOSIS — W3400XA Accidental discharge from unspecified firearms or gun, initial encounter: Secondary | ICD-10-CM

## 2019-11-17 DIAGNOSIS — F1010 Alcohol abuse, uncomplicated: Secondary | ICD-10-CM | POA: Diagnosis present

## 2019-11-17 DIAGNOSIS — Z20822 Contact with and (suspected) exposure to covid-19: Secondary | ICD-10-CM | POA: Diagnosis present

## 2019-11-17 DIAGNOSIS — S36898A Other injury of other intra-abdominal organs, initial encounter: Secondary | ICD-10-CM | POA: Diagnosis present

## 2019-11-17 DIAGNOSIS — S36892A Contusion of other intra-abdominal organs, initial encounter: Secondary | ICD-10-CM | POA: Diagnosis present

## 2019-11-17 DIAGNOSIS — S31633A Puncture wound without foreign body of abdominal wall, right lower quadrant with penetration into peritoneal cavity, initial encounter: Secondary | ICD-10-CM | POA: Diagnosis present

## 2019-11-17 DIAGNOSIS — S32302A Unspecified fracture of left ilium, initial encounter for closed fracture: Secondary | ICD-10-CM | POA: Diagnosis present

## 2019-11-17 DIAGNOSIS — S31139A Puncture wound of abdominal wall without foreign body, unspecified quadrant without penetration into peritoneal cavity, initial encounter: Secondary | ICD-10-CM

## 2019-11-17 DIAGNOSIS — F1290 Cannabis use, unspecified, uncomplicated: Secondary | ICD-10-CM | POA: Diagnosis present

## 2019-11-17 DIAGNOSIS — S31634A Puncture wound without foreign body of abdominal wall, left lower quadrant with penetration into peritoneal cavity, initial encounter: Secondary | ICD-10-CM | POA: Diagnosis present

## 2019-11-17 DIAGNOSIS — F17200 Nicotine dependence, unspecified, uncomplicated: Secondary | ICD-10-CM | POA: Diagnosis present

## 2019-11-17 DIAGNOSIS — K658 Other peritonitis: Secondary | ICD-10-CM | POA: Diagnosis present

## 2019-11-17 DIAGNOSIS — Z4659 Encounter for fitting and adjustment of other gastrointestinal appliance and device: Secondary | ICD-10-CM

## 2019-11-17 DIAGNOSIS — E871 Hypo-osmolality and hyponatremia: Secondary | ICD-10-CM | POA: Diagnosis present

## 2019-11-17 DIAGNOSIS — Y92512 Supermarket, store or market as the place of occurrence of the external cause: Secondary | ICD-10-CM | POA: Diagnosis not present

## 2019-11-17 DIAGNOSIS — S36498A Other injury of other part of small intestine, initial encounter: Secondary | ICD-10-CM | POA: Diagnosis present

## 2019-11-17 HISTORY — PX: LAPAROTOMY: SHX154

## 2019-11-17 LAB — COMPREHENSIVE METABOLIC PANEL
ALT: 29 U/L (ref 0–44)
AST: 29 U/L (ref 15–41)
Albumin: 4 g/dL (ref 3.5–5.0)
Alkaline Phosphatase: 61 U/L (ref 38–126)
Anion gap: 12 (ref 5–15)
BUN: 10 mg/dL (ref 6–20)
CO2: 20 mmol/L — ABNORMAL LOW (ref 22–32)
Calcium: 9.1 mg/dL (ref 8.9–10.3)
Chloride: 109 mmol/L (ref 98–111)
Creatinine, Ser: 1.03 mg/dL (ref 0.61–1.24)
GFR calc Af Amer: >60 mL/min (ref >60)
GFR calc non Af Amer: >60 mL/min (ref >60)
Glucose, Bld: 127 mg/dL — ABNORMAL HIGH (ref 70–99)
Potassium: 3.4 mmol/L — ABNORMAL LOW (ref 3.5–5.1)
Sodium: 141 mmol/L (ref 135–145)
Total Bilirubin: 0.5 mg/dL (ref 0.3–1.2)
Total Protein: 7.2 g/dL (ref 6.5–8.1)

## 2019-11-17 LAB — LACTIC ACID, PLASMA: Lactic Acid, Venous: 2.9 mmol/L (ref 0.5–1.9)

## 2019-11-17 LAB — I-STAT CHEM 8, ED
BUN: 11 mg/dL (ref 6–20)
Calcium, Ion: 1.16 mmol/L (ref 1.15–1.40)
Chloride: 107 mmol/L (ref 98–111)
Creatinine, Ser: 1 mg/dL (ref 0.61–1.24)
Glucose, Bld: 120 mg/dL — ABNORMAL HIGH (ref 70–99)
HCT: 47 % (ref 39.0–52.0)
Hemoglobin: 16 g/dL (ref 13.0–17.0)
Potassium: 3.2 mmol/L — ABNORMAL LOW (ref 3.5–5.1)
Sodium: 141 mmol/L (ref 135–145)
TCO2: 21 mmol/L — ABNORMAL LOW (ref 22–32)

## 2019-11-17 LAB — RESPIRATORY PANEL BY RT PCR (FLU A&B, COVID)
Influenza A by PCR: NEGATIVE
Influenza B by PCR: NEGATIVE
SARS Coronavirus 2 by RT PCR: NEGATIVE

## 2019-11-17 LAB — CBC
HCT: 42.4 % (ref 39.0–52.0)
HCT: 45.9 % (ref 39.0–52.0)
Hemoglobin: 14.7 g/dL (ref 13.0–17.0)
Hemoglobin: 15.9 g/dL (ref 13.0–17.0)
MCH: 31 pg (ref 26.0–34.0)
MCH: 31.1 pg (ref 26.0–34.0)
MCHC: 34.6 g/dL (ref 30.0–36.0)
MCHC: 34.7 g/dL (ref 30.0–36.0)
MCV: 89.5 fL (ref 80.0–100.0)
MCV: 89.8 fL (ref 80.0–100.0)
Platelets: 280 10*3/uL (ref 150–400)
Platelets: 321 10*3/uL (ref 150–400)
RBC: 4.72 MIL/uL (ref 4.22–5.81)
RBC: 5.13 MIL/uL (ref 4.22–5.81)
RDW: 12.8 % (ref 11.5–15.5)
RDW: 12.8 % (ref 11.5–15.5)
WBC: 13.3 10*3/uL — ABNORMAL HIGH (ref 4.0–10.5)
WBC: 17.9 10*3/uL — ABNORMAL HIGH (ref 4.0–10.5)
nRBC: 0 % (ref 0.0–0.2)
nRBC: 0 % (ref 0.0–0.2)

## 2019-11-17 LAB — PROTIME-INR
INR: 1 (ref 0.8–1.2)
Prothrombin Time: 12.8 seconds (ref 11.4–15.2)

## 2019-11-17 LAB — ETHANOL: Alcohol, Ethyl (B): 107 mg/dL — ABNORMAL HIGH (ref <10)

## 2019-11-17 LAB — SAMPLE TO BLOOD BANK

## 2019-11-17 LAB — MAGNESIUM: Magnesium: 1.4 mg/dL — ABNORMAL LOW (ref 1.7–2.4)

## 2019-11-17 SURGERY — LAPAROTOMY, EXPLORATORY
Anesthesia: General | Site: Abdomen

## 2019-11-17 MED ORDER — ONDANSETRON HCL 4 MG/2ML IJ SOLN
INTRAMUSCULAR | Status: AC
  Filled 2019-11-17: qty 2

## 2019-11-17 MED ORDER — ONDANSETRON HCL 4 MG/2ML IJ SOLN
4.0000 mg | Freq: Four times a day (QID) | INTRAMUSCULAR | Status: DC | PRN
  Administered 2019-11-17 (×2): 4 mg via INTRAVENOUS
  Filled 2019-11-17 (×2): qty 2

## 2019-11-17 MED ORDER — PHENYLEPHRINE 40 MCG/ML (10ML) SYRINGE FOR IV PUSH (FOR BLOOD PRESSURE SUPPORT)
PREFILLED_SYRINGE | INTRAVENOUS | Status: AC
  Filled 2019-11-17: qty 10

## 2019-11-17 MED ORDER — PHENYLEPHRINE HCL (PRESSORS) 10 MG/ML IV SOLN
INTRAVENOUS | Status: DC | PRN
  Administered 2019-11-17 (×2): 120 ug via INTRAVENOUS

## 2019-11-17 MED ORDER — METOPROLOL TARTRATE 5 MG/5ML IV SOLN: 5.0000 mg | Freq: Four times a day (QID) | INTRAVENOUS | Status: DC | PRN

## 2019-11-17 MED ORDER — ARTIFICIAL TEARS OPHTHALMIC OINT
TOPICAL_OINTMENT | OPHTHALMIC | Status: AC
  Filled 2019-11-17: qty 3.5

## 2019-11-17 MED ORDER — PROPOFOL 10 MG/ML IV BOLUS
INTRAVENOUS | Status: DC | PRN
  Administered 2019-11-17: 180 mg via INTRAVENOUS

## 2019-11-17 MED ORDER — LACTATED RINGERS IV SOLN: INTRAVENOUS | Status: DC | PRN

## 2019-11-17 MED ORDER — ROCURONIUM BROMIDE 10 MG/ML (PF) SYRINGE
PREFILLED_SYRINGE | INTRAVENOUS | Status: AC
  Filled 2019-11-17: qty 20

## 2019-11-17 MED ORDER — FENTANYL CITRATE (PF) 250 MCG/5ML IJ SOLN
INTRAMUSCULAR | Status: DC | PRN
  Administered 2019-11-17: 100 ug via INTRAVENOUS
  Administered 2019-11-17: 50 ug via INTRAVENOUS
  Administered 2019-11-17 (×2): 100 ug via INTRAVENOUS
  Administered 2019-11-17: 150 ug via INTRAVENOUS

## 2019-11-17 MED ORDER — PROMETHAZINE HCL 25 MG/ML IJ SOLN: 6.2500 mg | INTRAMUSCULAR | Status: DC | PRN

## 2019-11-17 MED ORDER — 0.9 % SODIUM CHLORIDE (POUR BTL) OPTIME
TOPICAL | Status: DC | PRN
  Administered 2019-11-17: 7000 mL

## 2019-11-17 MED ORDER — PROPOFOL 10 MG/ML IV BOLUS
INTRAVENOUS | Status: AC
  Filled 2019-11-17: qty 40

## 2019-11-17 MED ORDER — DIPHENHYDRAMINE HCL 50 MG/ML IJ SOLN: 25.0000 mg | Freq: Four times a day (QID) | INTRAMUSCULAR | Status: DC | PRN

## 2019-11-17 MED ORDER — PIPERACILLIN-TAZOBACTAM 3.375 G IVPB
3.3750 g | Freq: Three times a day (TID) | INTRAVENOUS | Status: DC
  Administered 2019-11-17 – 2019-11-21 (×13): 3.375 g via INTRAVENOUS
  Filled 2019-11-17 (×14): qty 50

## 2019-11-17 MED ORDER — MIDAZOLAM HCL 2 MG/2ML IJ SOLN
INTRAMUSCULAR | Status: DC | PRN
  Administered 2019-11-17: 2 mg via INTRAVENOUS

## 2019-11-17 MED ORDER — METHOCARBAMOL 1000 MG/10ML IJ SOLN
500.0000 mg | Freq: Four times a day (QID) | INTRAVENOUS | Status: DC
  Administered 2019-11-17 – 2019-11-19 (×9): 500 mg via INTRAVENOUS
  Filled 2019-11-17: qty 500
  Filled 2019-11-17 (×7): qty 5
  Filled 2019-11-17: qty 500
  Filled 2019-11-17 (×2): qty 5
  Filled 2019-11-17: qty 500

## 2019-11-17 MED ORDER — ACETAMINOPHEN 10 MG/ML IV SOLN
1000.0000 mg | Freq: Four times a day (QID) | INTRAVENOUS | Status: AC
  Administered 2019-11-17 – 2019-11-18 (×4): 1000 mg via INTRAVENOUS
  Filled 2019-11-17 (×4): qty 100

## 2019-11-17 MED ORDER — MIDAZOLAM HCL 2 MG/2ML IJ SOLN
INTRAMUSCULAR | Status: AC
  Filled 2019-11-17: qty 2

## 2019-11-17 MED ORDER — LACTATED RINGERS IV SOLN
INTRAVENOUS | Status: AC | PRN
  Administered 2019-11-17: 1000 mL via INTRAVENOUS

## 2019-11-17 MED ORDER — TETANUS-DIPHTH-ACELL PERTUSSIS 5-2.5-18.5 LF-MCG/0.5 IM SUSP: 0.5000 mL | Freq: Once | INTRAMUSCULAR | Status: DC

## 2019-11-17 MED ORDER — MENTHOL 3 MG MT LOZG
1.0000 | LOZENGE | OROMUCOSAL | Status: DC | PRN
  Filled 2019-11-17: qty 9

## 2019-11-17 MED ORDER — DIPHENHYDRAMINE HCL 25 MG PO CAPS: 25.0000 mg | ORAL_CAPSULE | Freq: Four times a day (QID) | ORAL | Status: DC | PRN

## 2019-11-17 MED ORDER — SODIUM CHLORIDE 0.9 % IV SOLN: INTRAVENOUS | Status: DC

## 2019-11-17 MED ORDER — SUCCINYLCHOLINE CHLORIDE 20 MG/ML IJ SOLN
INTRAMUSCULAR | Status: DC | PRN
  Administered 2019-11-17: 120 mg via INTRAVENOUS

## 2019-11-17 MED ORDER — DIPHENHYDRAMINE HCL 50 MG/ML IJ SOLN
INTRAMUSCULAR | Status: DC | PRN
  Administered 2019-11-17: 25 mg via INTRAVENOUS

## 2019-11-17 MED ORDER — ROCURONIUM BROMIDE 100 MG/10ML IV SOLN
INTRAVENOUS | Status: DC | PRN
  Administered 2019-11-17: 20 mg via INTRAVENOUS
  Administered 2019-11-17: 100 mg via INTRAVENOUS

## 2019-11-17 MED ORDER — GABAPENTIN 300 MG PO CAPS: 300.0000 mg | ORAL_CAPSULE | Freq: Two times a day (BID) | ORAL | Status: DC

## 2019-11-17 MED ORDER — ALBUMIN HUMAN 5 % IV SOLN: INTRAVENOUS | Status: DC | PRN

## 2019-11-17 MED ORDER — FENTANYL CITRATE (PF) 100 MCG/2ML IJ SOLN: 25.0000 ug | INTRAMUSCULAR | Status: DC | PRN

## 2019-11-17 MED ORDER — ONDANSETRON HCL 4 MG/2ML IJ SOLN
INTRAMUSCULAR | Status: DC | PRN
  Administered 2019-11-17: 4 mg via INTRAVENOUS

## 2019-11-17 MED ORDER — FENTANYL CITRATE (PF) 250 MCG/5ML IJ SOLN
INTRAMUSCULAR | Status: AC
  Filled 2019-11-17: qty 5

## 2019-11-17 MED ORDER — KCL IN DEXTROSE-NACL 20-5-0.45 MEQ/L-%-% IV SOLN
INTRAVENOUS | Status: DC
  Filled 2019-11-17 (×3): qty 1000

## 2019-11-17 MED ORDER — METHOCARBAMOL 500 MG PO TABS: 500.0000 mg | ORAL_TABLET | Freq: Four times a day (QID) | ORAL | Status: DC | PRN

## 2019-11-17 MED ORDER — OXYCODONE HCL 5 MG PO TABS: 5.0000 mg | ORAL_TABLET | Freq: Four times a day (QID) | ORAL | Status: DC | PRN

## 2019-11-17 MED ORDER — SUCCINYLCHOLINE CHLORIDE 200 MG/10ML IV SOSY
PREFILLED_SYRINGE | INTRAVENOUS | Status: AC
  Filled 2019-11-17: qty 10

## 2019-11-17 MED ORDER — KETOROLAC TROMETHAMINE 30 MG/ML IJ SOLN
30.0000 mg | Freq: Three times a day (TID) | INTRAMUSCULAR | Status: AC
  Administered 2019-11-17 – 2019-11-19 (×9): 30 mg via INTRAVENOUS
  Filled 2019-11-17 (×9): qty 1

## 2019-11-17 MED ORDER — HYDRALAZINE HCL 20 MG/ML IJ SOLN: 10.0000 mg | INTRAMUSCULAR | Status: DC | PRN

## 2019-11-17 MED ORDER — ONDANSETRON 4 MG PO TBDP: 4.0000 mg | ORAL_TABLET | Freq: Four times a day (QID) | ORAL | Status: DC | PRN

## 2019-11-17 MED ORDER — MORPHINE SULFATE (PF) 4 MG/ML IV SOLN
INTRAVENOUS | Status: AC
  Filled 2019-11-17: qty 1

## 2019-11-17 MED ORDER — MORPHINE SULFATE 2 MG/ML IJ SOLN
INTRAMUSCULAR | Status: AC | PRN
  Administered 2019-11-17: 4 mg via INTRAVENOUS

## 2019-11-17 MED ORDER — CEFAZOLIN SODIUM-DEXTROSE 2-4 GM/100ML-% IV SOLN: 2.0000 g | INTRAVENOUS | Status: DC

## 2019-11-17 MED ORDER — SUGAMMADEX SODIUM 200 MG/2ML IV SOLN
INTRAVENOUS | Status: DC | PRN
  Administered 2019-11-17: 300 mg via INTRAVENOUS

## 2019-11-17 MED ORDER — ACETAMINOPHEN 500 MG PO TABS: 1000.0000 mg | ORAL_TABLET | Freq: Four times a day (QID) | ORAL | Status: DC

## 2019-11-17 MED ORDER — EPHEDRINE 5 MG/ML INJ
INTRAVENOUS | Status: AC
  Filled 2019-11-17: qty 10

## 2019-11-17 MED ORDER — DIPHENHYDRAMINE HCL 50 MG/ML IJ SOLN
INTRAMUSCULAR | Status: AC
  Filled 2019-11-17: qty 1

## 2019-11-17 MED ORDER — SUGAMMADEX SODIUM 500 MG/5ML IV SOLN
INTRAVENOUS | Status: AC
  Filled 2019-11-17: qty 5

## 2019-11-17 MED ORDER — SODIUM CHLORIDE (PF) 0.9 % IJ SOLN
INTRAMUSCULAR | Status: AC
  Filled 2019-11-17: qty 10

## 2019-11-17 MED ORDER — POTASSIUM CHLORIDE 2 MEQ/ML IV SOLN: INTRAVENOUS | Status: DC

## 2019-11-17 MED ORDER — MAGNESIUM SULFATE 4 GM/100ML IV SOLN
4.0000 g | Freq: Once | INTRAVENOUS | Status: AC
  Administered 2019-11-17: 4 g via INTRAVENOUS
  Filled 2019-11-17: qty 100

## 2019-11-17 MED ORDER — PHENOL 1.4 % MT LIQD
1.0000 | OROMUCOSAL | Status: DC | PRN
  Administered 2019-11-17: 1 via OROMUCOSAL
  Filled 2019-11-17: qty 177

## 2019-11-17 MED ORDER — LIDOCAINE HCL (CARDIAC) PF 100 MG/5ML IV SOSY
PREFILLED_SYRINGE | INTRAVENOUS | Status: DC | PRN
  Administered 2019-11-17: 100 mg via INTRATRACHEAL

## 2019-11-17 MED ORDER — ENOXAPARIN SODIUM 40 MG/0.4ML ~~LOC~~ SOLN
40.0000 mg | SUBCUTANEOUS | Status: DC
  Administered 2019-11-18 – 2019-11-21 (×4): 40 mg via SUBCUTANEOUS
  Filled 2019-11-17 (×4): qty 0.4

## 2019-11-17 MED ORDER — HYDROMORPHONE HCL 1 MG/ML IJ SOLN
0.5000 mg | INTRAMUSCULAR | Status: DC | PRN
  Administered 2019-11-17 – 2019-11-19 (×4): 1 mg via INTRAVENOUS
  Filled 2019-11-17 (×5): qty 1

## 2019-11-17 MED ORDER — LIDOCAINE 2% (20 MG/ML) 5 ML SYRINGE
INTRAMUSCULAR | Status: AC
  Filled 2019-11-17: qty 5

## 2019-11-17 MED ORDER — CEFAZOLIN SODIUM-DEXTROSE 2-3 GM-%(50ML) IV SOLR
INTRAVENOUS | Status: DC | PRN
  Administered 2019-11-17: 2 g via INTRAVENOUS

## 2019-11-17 MED ORDER — HYDROMORPHONE HCL 1 MG/ML IJ SOLN
0.5000 mg | INTRAMUSCULAR | Status: DC | PRN
  Administered 2019-11-17: 1 mg via INTRAVENOUS
  Filled 2019-11-17: qty 1

## 2019-11-17 MED ORDER — IOHEXOL 300 MG/ML  SOLN
100.0000 mL | Freq: Once | INTRAMUSCULAR | Status: AC | PRN
  Administered 2019-11-17: 100 mL via INTRAVENOUS

## 2019-11-17 MED ORDER — PANTOPRAZOLE SODIUM 40 MG IV SOLR
40.0000 mg | Freq: Every day | INTRAVENOUS | Status: DC
  Administered 2019-11-17 – 2019-11-20 (×4): 40 mg via INTRAVENOUS
  Filled 2019-11-17 (×4): qty 40

## 2019-11-17 MED ORDER — CEFAZOLIN SODIUM 1 G IJ SOLR
INTRAMUSCULAR | Status: AC
  Filled 2019-11-17: qty 20

## 2019-11-17 SURGICAL SUPPLY — 51 items
BLADE CLIPPER SURG (BLADE) ×2 IMPLANT
BNDG GAUZE ELAST 4 BULKY (GAUZE/BANDAGES/DRESSINGS) ×2 IMPLANT
CANISTER SUCT 3000ML PPV (MISCELLANEOUS) ×3 IMPLANT
CHLORAPREP W/TINT 26 (MISCELLANEOUS) ×1 IMPLANT
COVER SURGICAL LIGHT HANDLE (MISCELLANEOUS) ×3 IMPLANT
COVER WAND RF STERILE (DRAPES) ×1 IMPLANT
DRAPE LAPAROSCOPIC ABDOMINAL (DRAPES) ×3 IMPLANT
DRAPE WARM FLUID 44X44 (DRAPES) ×1 IMPLANT
DRSG OPSITE POSTOP 4X10 (GAUZE/BANDAGES/DRESSINGS) IMPLANT
DRSG OPSITE POSTOP 4X8 (GAUZE/BANDAGES/DRESSINGS) IMPLANT
ELECT BLADE 4.0 EZ CLEAN MEGAD (MISCELLANEOUS) ×3
ELECT BLADE 6.5 EXT (BLADE) ×2 IMPLANT
ELECT CAUTERY BLADE 6.4 (BLADE) ×3 IMPLANT
ELECT REM PT RETURN 9FT ADLT (ELECTROSURGICAL) ×3
ELECTRODE BLDE 4.0 EZ CLN MEGD (MISCELLANEOUS) IMPLANT
ELECTRODE REM PT RTRN 9FT ADLT (ELECTROSURGICAL) ×1 IMPLANT
GAUZE SPONGE 4X4 12PLY STRL (GAUZE/BANDAGES/DRESSINGS) ×2 IMPLANT
GLOVE BIO SURGEON STRL SZ 6 (GLOVE) ×3 IMPLANT
GLOVE INDICATOR 6.5 STRL GRN (GLOVE) ×3 IMPLANT
GOWN STRL REUS W/ TWL LRG LVL3 (GOWN DISPOSABLE) ×2 IMPLANT
GOWN STRL REUS W/TWL LRG LVL3 (GOWN DISPOSABLE) ×4
HANDLE SUCTION POOLE (INSTRUMENTS) ×1 IMPLANT
KIT BASIN OR (CUSTOM PROCEDURE TRAY) ×3 IMPLANT
KIT TURNOVER KIT B (KITS) ×3 IMPLANT
LIGASURE IMPACT 36 18CM CVD LR (INSTRUMENTS) ×2 IMPLANT
NS IRRIG 1000ML POUR BTL (IV SOLUTION) ×6 IMPLANT
PACK GENERAL/GYN (CUSTOM PROCEDURE TRAY) ×3 IMPLANT
PAD ABD 8X10 STRL (GAUZE/BANDAGES/DRESSINGS) ×2 IMPLANT
PAD ARMBOARD 7.5X6 YLW CONV (MISCELLANEOUS) ×3 IMPLANT
PENCIL SMOKE EVACUATOR (MISCELLANEOUS) ×3 IMPLANT
RELOAD PROXIMATE 75MM BLUE (ENDOMECHANICALS) ×21 IMPLANT
RELOAD PROXIMATE TA60MM BLUE (ENDOMECHANICALS) ×3 IMPLANT
RELOAD STAPLE 60 BLU REG PROX (ENDOMECHANICALS) IMPLANT
RELOAD STAPLE 75 3.8 BLU REG (ENDOMECHANICALS) IMPLANT
SPECIMEN JAR LARGE (MISCELLANEOUS) IMPLANT
SPONGE LAP 18X18 RF (DISPOSABLE) ×4 IMPLANT
STAPLER GUN LINEAR PROX 60 (STAPLE) ×2 IMPLANT
STAPLER PROXIMATE 75MM BLUE (STAPLE) ×2 IMPLANT
STAPLER VISISTAT 35W (STAPLE) ×3 IMPLANT
SUCTION POOLE HANDLE (INSTRUMENTS) ×3
SUT PDS AB 1 TP1 96 (SUTURE) ×6 IMPLANT
SUT SILK 2 0 SH CR/8 (SUTURE) ×3 IMPLANT
SUT SILK 2 0 TIES 10X30 (SUTURE) ×3 IMPLANT
SUT SILK 3 0 SH CR/8 (SUTURE) ×5 IMPLANT
SUT SILK 3 0 TIES 10X30 (SUTURE) ×3 IMPLANT
SUT VIC AB 3-0 SH 18 (SUTURE) IMPLANT
SUT VIC AB 3-0 SH 27 (SUTURE) ×4
SUT VIC AB 3-0 SH 27X BRD (SUTURE) IMPLANT
TAPE CLOTH SURG 6X10 WHT LF (GAUZE/BANDAGES/DRESSINGS) ×2 IMPLANT
TOWEL GREEN STERILE (TOWEL DISPOSABLE) ×3 IMPLANT
TRAY FOLEY MTR SLVR 16FR STAT (SET/KITS/TRAYS/PACK) ×2 IMPLANT
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
## 2019-11-17 NOTE — ED Notes (Signed)
Transported to OR by nurse
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
## 2019-11-17 NOTE — Anesthesia Postprocedure Evaluation (Signed)
Anesthesia Post Note  Patient: Pharmacologist  Procedure(s) Performed: EXPLORATORY LAPAROTOMY SMALL BOWEL RESECTION TIMES TWO, SIGMOID RESECTION (N/A Abdomen)     Anesthesia Post Evaluation  Last Vitals:  Vitals:   11/17/19 0500 11/17/19 0525  BP: 127/68 (!) 142/92  Pulse: (!) 103   Resp: (!) 24 20  Temp: 36.4 C 36.5 C  SpO2: 93% 98%    Last Pain:  Vitals:   11/17/19 0525  TempSrc: Axillary  PainSc:                  Cecile Hearing
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
## 2019-11-17 NOTE — Transfer of Care (Signed)
Immediate Anesthesia Transfer of Care Note  Patient: [name]  Procedure(s) Performed: EXPLORATORY LAPAROTOMY SMALL BOWEL RESECTION TIMES TWO, SIGMOID RESECTION (N/A Abdomen)  Patient Location: PACU  Anesthesia Type:General  Level of Consciousness: sedated  Airway & Oxygen Therapy: Patient Spontanous Breathing and Patient connected to face mask oxygen  Post-op Assessment: Report given to RN and Post -op Vital signs reviewed and stable  Post vital signs: Reviewed and stable  Last Vitals:  Vitals Value Taken Time  BP 142/92 11/17/19 0400  Temp    Pulse 107 11/17/19 0402  Resp 25 11/17/19 0402  SpO2 98 % 11/17/19 0402  Vitals shown include unvalidated device data.  Last Pain:  Vitals:   11/17/19 0017  TempSrc:   PainSc: 10-Worst pain ever         Complications: No apparent anesthesia complications

## 2019-11-17 NOTE — Progress Notes (Signed)
Lab called to report crit lab value lactic acid 2.9 from ED.
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
## 2019-11-17 NOTE — ED Notes (Signed)
[employer] is requesting a call if patient declines/becomes critical so that they can elevate their investigation. Please call the Watch Commander/Communications @ [phone]

## 2019-11-17 NOTE — Anesthesia Procedure Notes (Signed)
Procedure Name: Intubation Date/Time: 11/17/2019 1:34 AM Performed by: Claudina Lick, CRNA Pre-anesthesia Checklist: Patient identified, Emergency Drugs available, Suction available, Patient being monitored and Timeout performed Patient Re-evaluated:Patient Re-evaluated prior to induction Oxygen Delivery Method: Circle system utilized Preoxygenation: Pre-oxygenation with 100% oxygen Induction Type: IV induction, Rapid sequence and Cricoid Pressure applied Laryngoscope Size: Miller and 2 Grade View: Grade I Tube type: Oral Tube size: 7.5 mm Number of attempts: 1 Airway Equipment and Method: Stylet Placement Confirmation: ETT inserted through vocal cords under direct vision,  positive ETCO2 and breath sounds checked- equal and bilateral Secured at: 23 cm Tube secured with: Tape Dental Injury: Teeth and Oropharynx as per pre-operative assessment
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
## 2019-11-18 LAB — BASIC METABOLIC PANEL
Anion gap: 9 (ref 5–15)
BUN: 7 mg/dL (ref 6–20)
CO2: 23 mmol/L (ref 22–32)
Calcium: 8.5 mg/dL — ABNORMAL LOW (ref 8.9–10.3)
Chloride: 100 mmol/L (ref 98–111)
Creatinine, Ser: 0.73 mg/dL (ref 0.61–1.24)
GFR calc Af Amer: >60 mL/min (ref >60)
GFR calc non Af Amer: >60 mL/min (ref >60)
Glucose, Bld: 162 mg/dL — ABNORMAL HIGH (ref 70–99)
Potassium: 3.7 mmol/L (ref 3.5–5.1)
Sodium: 132 mmol/L — ABNORMAL LOW (ref 135–145)

## 2019-11-18 LAB — CBC
HCT: 36.9 % — ABNORMAL LOW (ref 39.0–52.0)
Hemoglobin: 12.7 g/dL — ABNORMAL LOW (ref 13.0–17.0)
MCH: 31.2 pg (ref 26.0–34.0)
MCHC: 34.4 g/dL (ref 30.0–36.0)
MCV: 90.7 fL (ref 80.0–100.0)
Platelets: 227 10*3/uL (ref 150–400)
RBC: 4.07 MIL/uL — ABNORMAL LOW (ref 4.22–5.81)
RDW: 12.8 % (ref 11.5–15.5)
WBC: 12.6 10*3/uL — ABNORMAL HIGH (ref 4.0–10.5)
nRBC: 0 % (ref 0.0–0.2)

## 2019-11-18 LAB — SURGICAL PATHOLOGY

## 2019-11-18 MED ORDER — SODIUM CHLORIDE 0.9 % IV SOLN: INTRAVENOUS | Status: DC

## 2019-11-18 MED ORDER — ACETAMINOPHEN 500 MG PO TABS
1000.0000 mg | ORAL_TABLET | Freq: Four times a day (QID) | ORAL | Status: DC
  Administered 2019-11-18 – 2019-11-21 (×11): 1000 mg via ORAL
  Filled 2019-11-18 (×12): qty 2

## 2019-11-18 MED ORDER — POTASSIUM CHLORIDE IN NACL 20-0.9 MEQ/L-% IV SOLN
INTRAVENOUS | Status: DC
  Filled 2019-11-18 (×4): qty 1000

## 2019-11-18 NOTE — Plan of Care (Signed)
  Problem: Health Behavior/Discharge Planning: Goal: Ability to manage health-related needs will improve Outcome: Progressing   Problem: Clinical Measurements: Goal: Respiratory complications will improve Outcome: Progressing   Problem: Coping: Goal: Level of anxiety will decrease Outcome: Not Progressing
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
## 2019-11-19 LAB — CBC
HCT: 32.9 % — ABNORMAL LOW (ref 39.0–52.0)
Hemoglobin: 11.3 g/dL — ABNORMAL LOW (ref 13.0–17.0)
MCH: 31.2 pg (ref 26.0–34.0)
MCHC: 34.3 g/dL (ref 30.0–36.0)
MCV: 90.9 fL (ref 80.0–100.0)
Platelets: 244 10*3/uL (ref 150–400)
RBC: 3.62 MIL/uL — ABNORMAL LOW (ref 4.22–5.81)
RDW: 12.6 % (ref 11.5–15.5)
WBC: 12.8 10*3/uL — ABNORMAL HIGH (ref 4.0–10.5)
nRBC: 0 % (ref 0.0–0.2)

## 2019-11-19 LAB — BASIC METABOLIC PANEL
Anion gap: 7 (ref 5–15)
BUN: 8 mg/dL (ref 6–20)
CO2: 23 mmol/L (ref 22–32)
Calcium: 8.7 mg/dL — ABNORMAL LOW (ref 8.9–10.3)
Chloride: 106 mmol/L (ref 98–111)
Creatinine, Ser: 0.85 mg/dL (ref 0.61–1.24)
GFR calc Af Amer: >60 mL/min (ref >60)
GFR calc non Af Amer: >60 mL/min (ref >60)
Glucose, Bld: 106 mg/dL — ABNORMAL HIGH (ref 70–99)
Potassium: 4.2 mmol/L (ref 3.5–5.1)
Sodium: 136 mmol/L (ref 135–145)

## 2019-11-19 MED ORDER — OXYCODONE HCL 5 MG PO TABS
5.0000 mg | ORAL_TABLET | ORAL | Status: DC | PRN
  Administered 2019-11-19: 18:00:00 10 mg via ORAL
  Administered 2019-11-20: 5 mg via ORAL
  Administered 2019-11-20 – 2019-11-21 (×2): 10 mg via ORAL
  Administered 2019-11-21: 5 mg via ORAL
  Filled 2019-11-19: qty 2
  Filled 2019-11-19: qty 1
  Filled 2019-11-19: qty 2
  Filled 2019-11-19: qty 1
  Filled 2019-11-19: qty 2

## 2019-11-19 MED ORDER — HYDROMORPHONE HCL 1 MG/ML IJ SOLN: 0.5000 mg | INTRAMUSCULAR | Status: DC | PRN

## 2019-11-19 MED ORDER — METHOCARBAMOL 500 MG PO TABS: 500.0000 mg | ORAL_TABLET | Freq: Four times a day (QID) | ORAL | Status: DC | PRN
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
## 2019-11-20 LAB — CBC
HCT: 33.3 % — ABNORMAL LOW (ref 39.0–52.0)
Hemoglobin: 11.5 g/dL — ABNORMAL LOW (ref 13.0–17.0)
MCH: 31.3 pg (ref 26.0–34.0)
MCHC: 34.5 g/dL (ref 30.0–36.0)
MCV: 90.5 fL (ref 80.0–100.0)
Platelets: 265 10*3/uL (ref 150–400)
RBC: 3.68 MIL/uL — ABNORMAL LOW (ref 4.22–5.81)
RDW: 12.2 % (ref 11.5–15.5)
WBC: 10.3 10*3/uL (ref 4.0–10.5)
nRBC: 0 % (ref 0.0–0.2)
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
## 2019-11-21 MED ORDER — HYDROCODONE-ACETAMINOPHEN 5-325 MG PO TABS: 1.0000 | ORAL_TABLET | Freq: Four times a day (QID) | ORAL | 0 refills | Status: DC | PRN
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
## 2019-11-26 ENCOUNTER — Encounter (HOSPITAL_COMMUNITY): Payer: Self-pay | Admitting: Emergency Medicine

## 2020-02-01 ENCOUNTER — Other Ambulatory Visit: Payer: Self-pay

## 2020-02-01 ENCOUNTER — Encounter (HOSPITAL_COMMUNITY): Payer: Self-pay

## 2020-02-01 ENCOUNTER — Ambulatory Visit (HOSPITAL_COMMUNITY)
Admission: EM | Admit: 2020-02-01 | Discharge: 2020-02-01 | Disposition: A | Discharge status: Home / Self Care | Payer: 59 | Attending: Family Medicine | Admitting: Family Medicine

## 2020-02-01 DIAGNOSIS — K0889 Other specified disorders of teeth and supporting structures: Secondary | ICD-10-CM

## 2020-02-01 MED ORDER — PENICILLIN V POTASSIUM 500 MG PO TABS: 500.0000 mg | ORAL_TABLET | Freq: Four times a day (QID) | ORAL | 0 refills | Status: AC

## 2020-02-01 MED ORDER — HYDROCODONE-ACETAMINOPHEN 5-325 MG PO TABS: 1.0000 | ORAL_TABLET | Freq: Four times a day (QID) | ORAL | 0 refills | Status: AC | PRN
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
## 2020-02-01 NOTE — Discharge Instructions (Signed)
Take penicillin as directed Take pain medication as needed Follow-up with a dentist   do not drive on pain medicine

## 2020-02-01 NOTE — ED Triage Notes (Signed)
Pt reports left sided headache, left sided dental pain and left ear pain x 3 days.

## 2021-02-26 ENCOUNTER — Emergency Department (HOSPITAL_BASED_OUTPATIENT_CLINIC_OR_DEPARTMENT_OTHER)
Admission: EM | Admit: 2021-02-26 | Discharge: 2021-02-26 | Disposition: A | Discharge status: Home / Self Care | Payer: Self-pay | Attending: Emergency Medicine | Admitting: Emergency Medicine

## 2021-02-26 ENCOUNTER — Other Ambulatory Visit: Payer: Self-pay

## 2021-02-26 ENCOUNTER — Encounter (HOSPITAL_BASED_OUTPATIENT_CLINIC_OR_DEPARTMENT_OTHER): Payer: Self-pay | Admitting: Obstetrics and Gynecology

## 2021-02-26 ENCOUNTER — Emergency Department (HOSPITAL_BASED_OUTPATIENT_CLINIC_OR_DEPARTMENT_OTHER): Payer: Self-pay

## 2021-02-26 DIAGNOSIS — Z87891 Personal history of nicotine dependence: Secondary | ICD-10-CM | POA: Insufficient documentation

## 2021-02-26 DIAGNOSIS — S60221A Contusion of right hand, initial encounter: Secondary | ICD-10-CM | POA: Insufficient documentation

## 2021-02-26 DIAGNOSIS — W228XXA Striking against or struck by other objects, initial encounter: Secondary | ICD-10-CM | POA: Insufficient documentation

## 2021-02-26 MED ORDER — KETOROLAC TROMETHAMINE 10 MG PO TABS
10.0000 mg | ORAL_TABLET | Freq: Once | ORAL | Status: AC
  Administered 2021-02-26: 10 mg via ORAL
  Filled 2021-02-26: qty 1
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
## 2022-07-23 IMAGING — DX DG HAND COMPLETE 3+V*R*
1 series · 3 of 3 positions shown · non-contrast
Comparison: None.

CLINICAL DATA: Boxing injury.

EXAM:
RIGHT WRIST - COMPLETE 3+ VIEW; RIGHT HAND - COMPLETE 3+ VIEW

[Series 1: hand · 0.14mm/px · 3 of 3 slices shown]
[im 1/3]
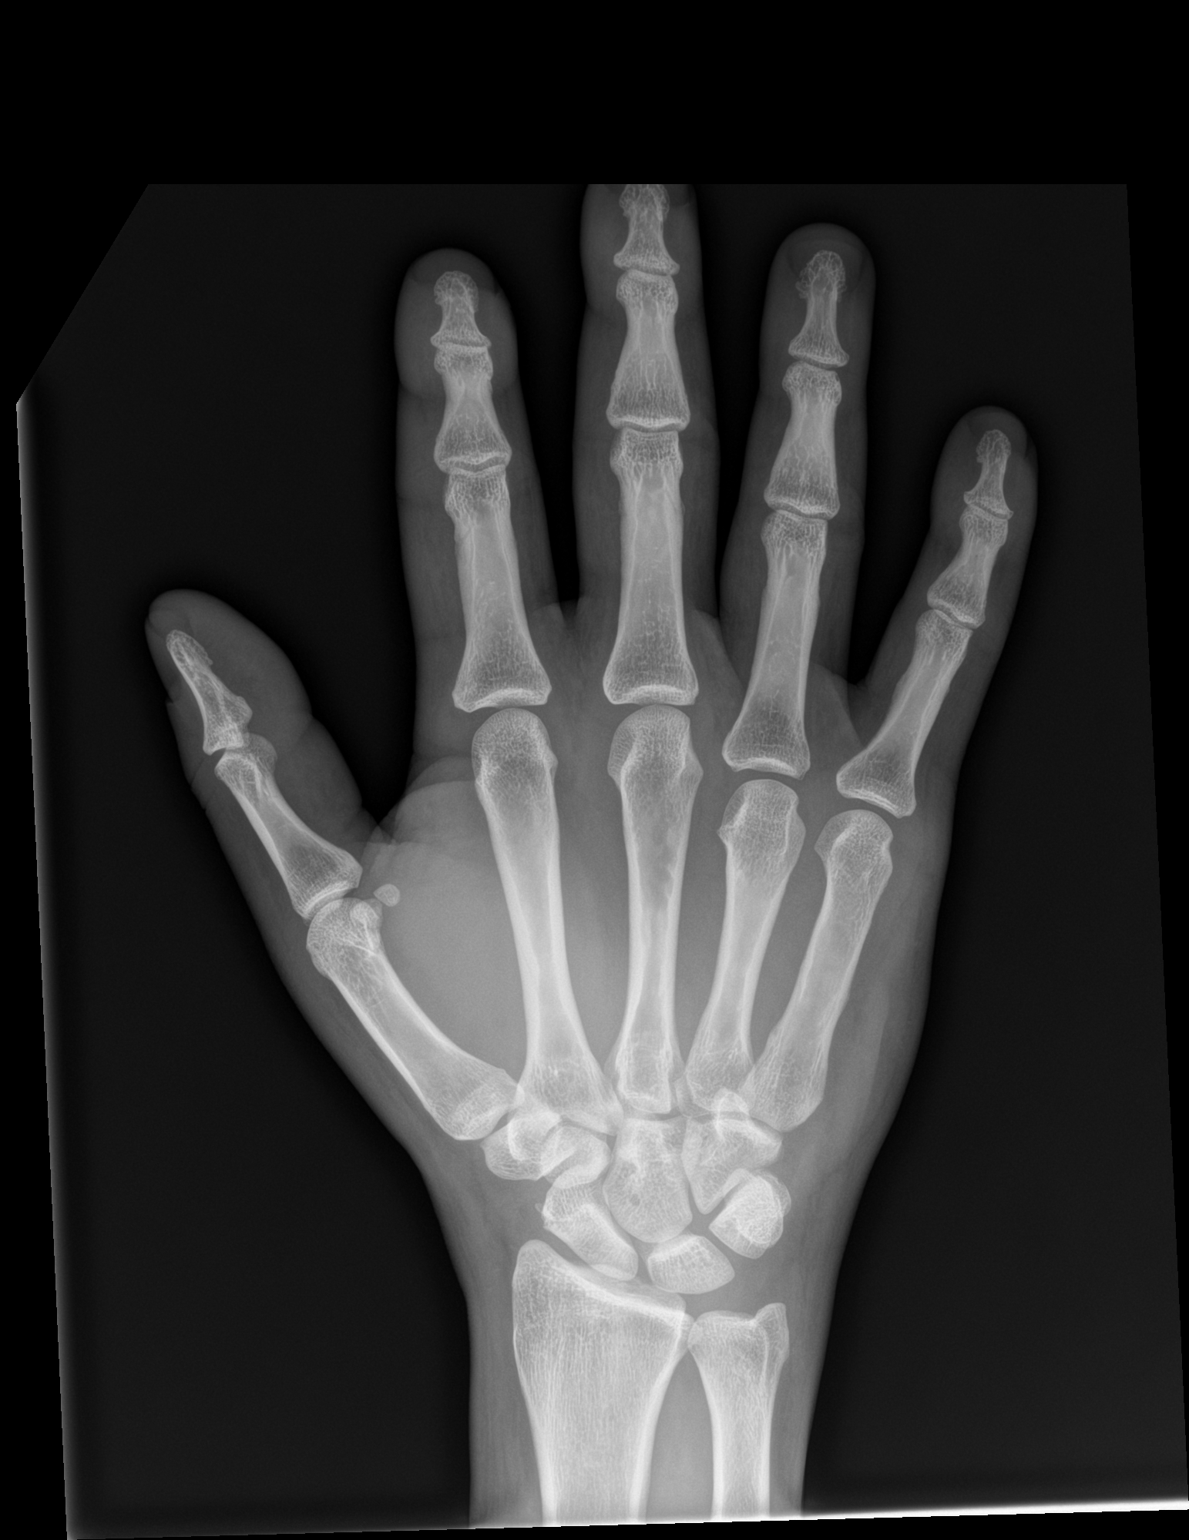
[im 2/3]
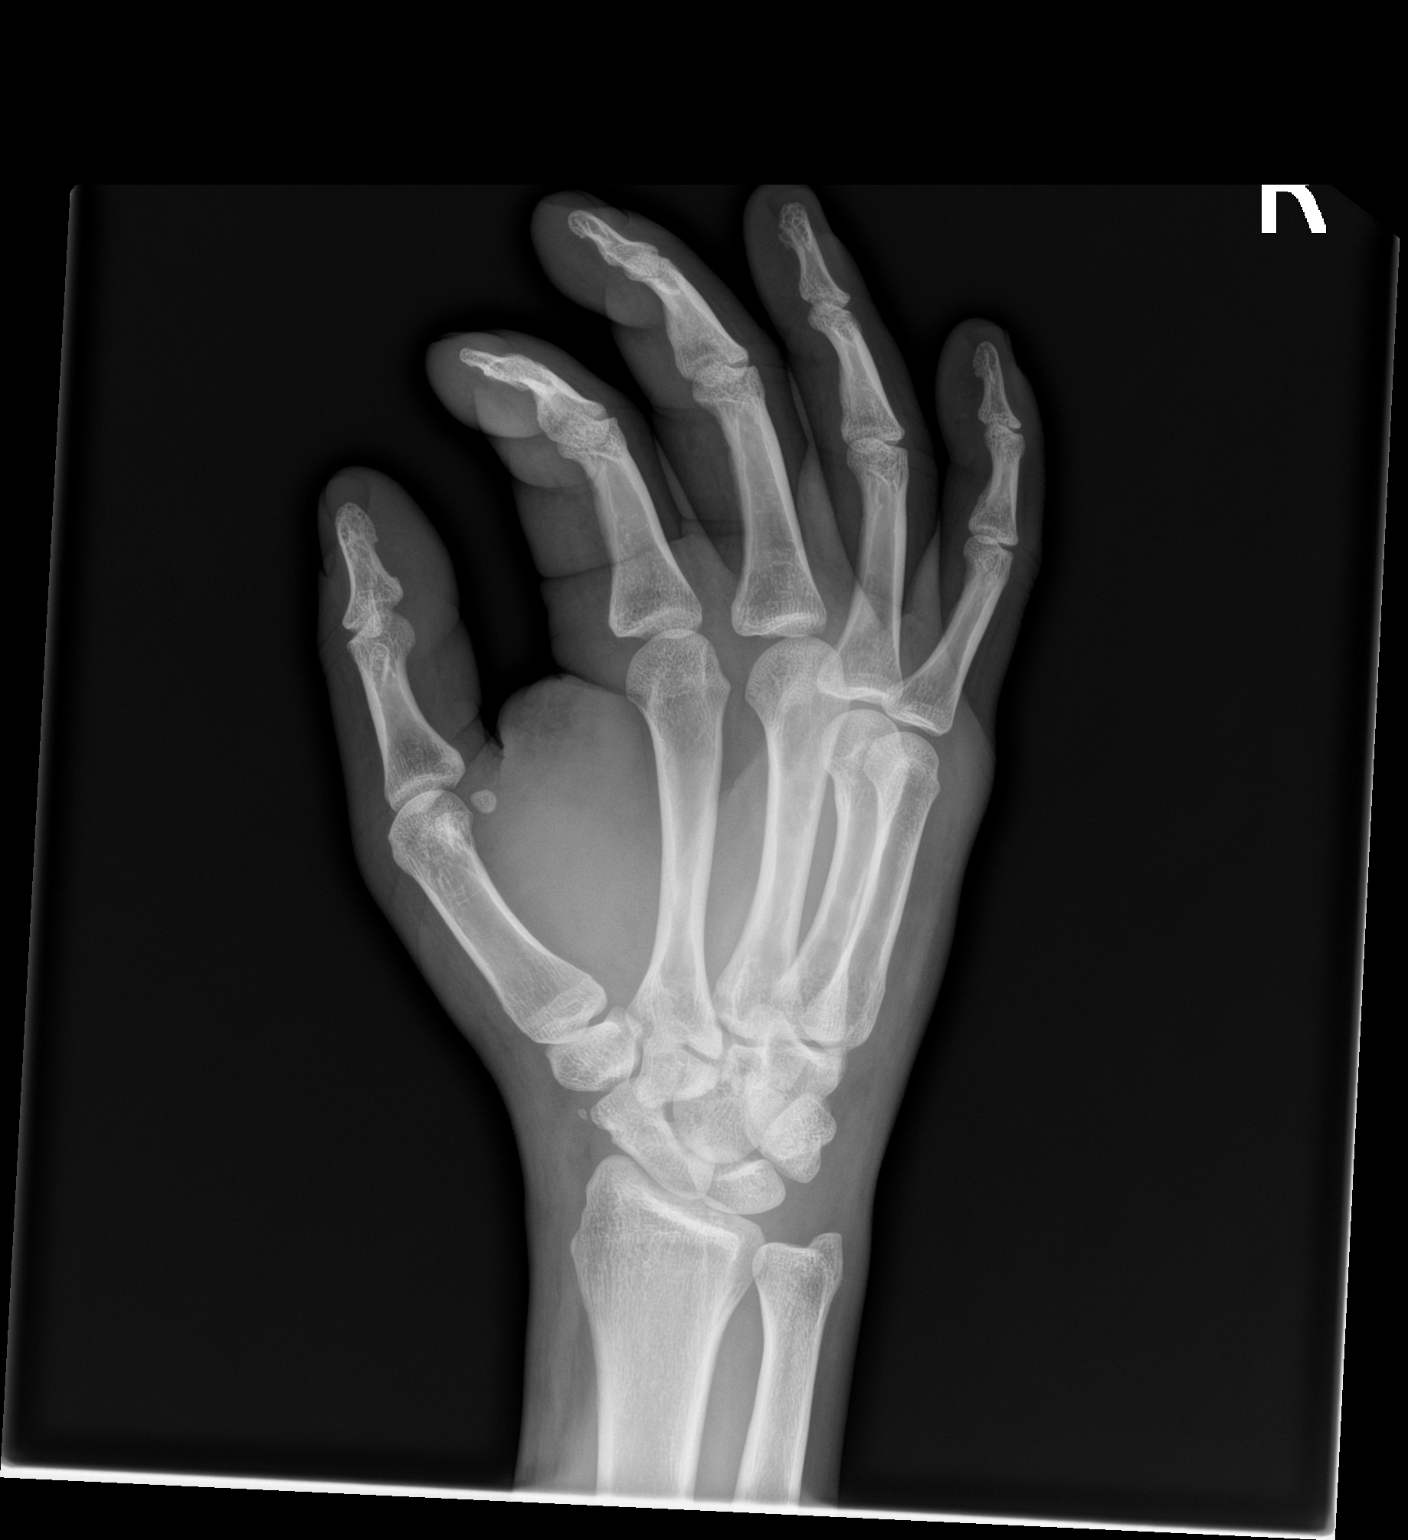
[im 3/3]
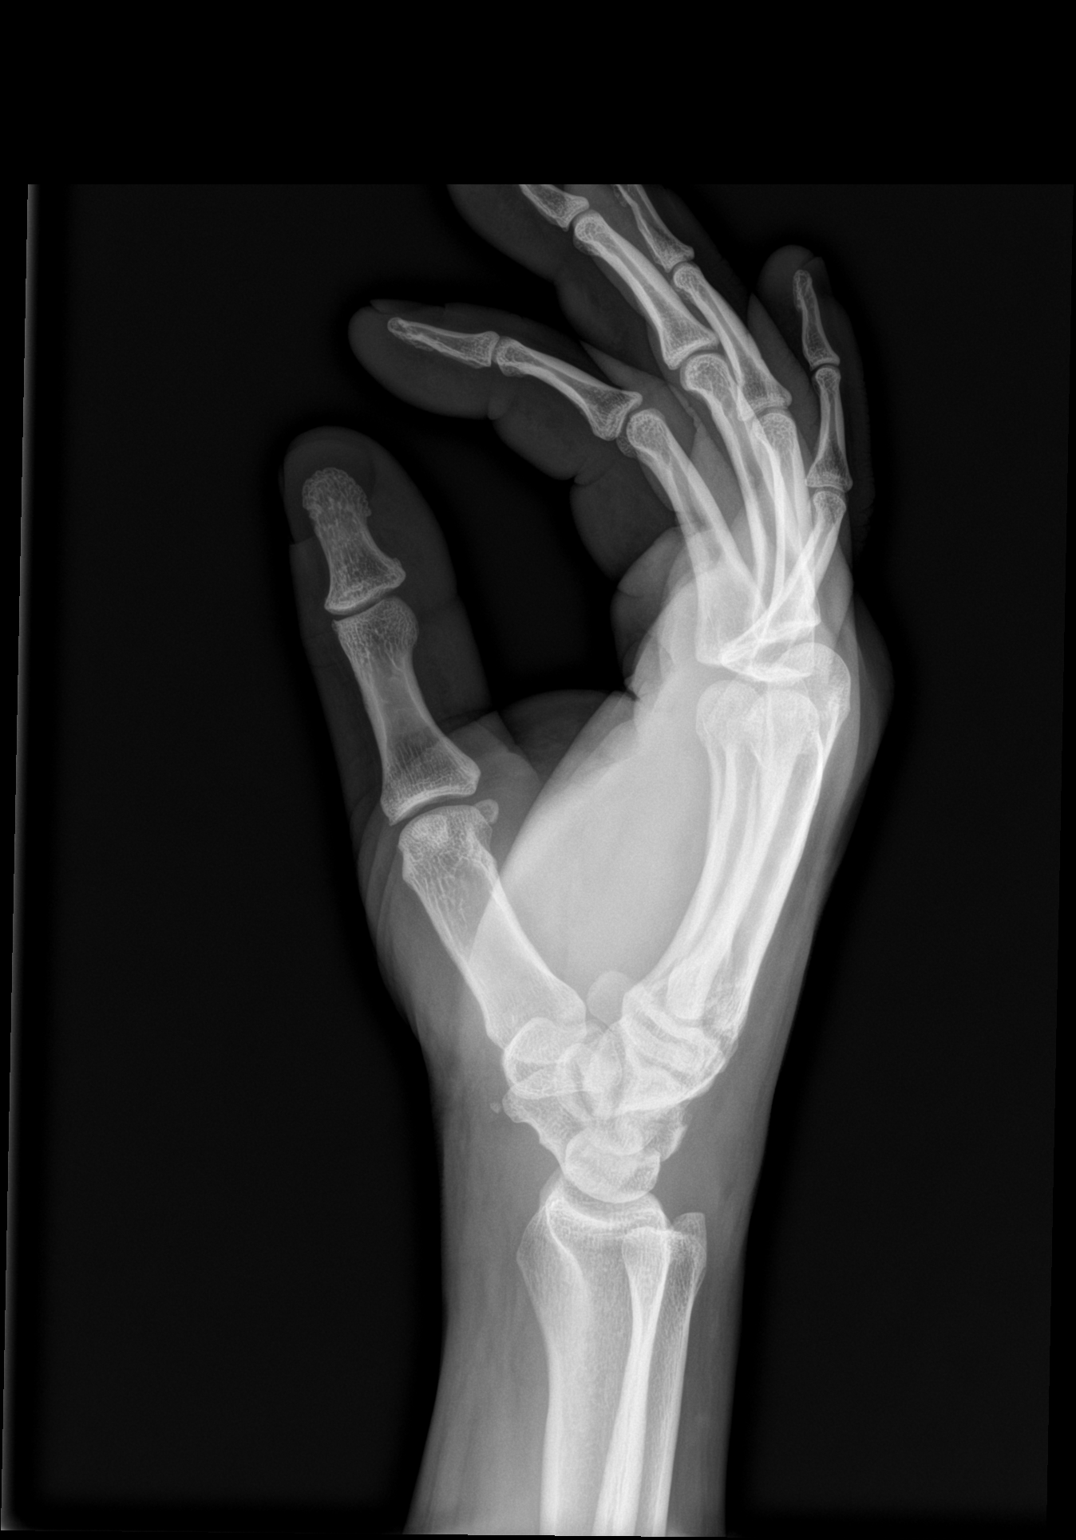

[3 of 3 positions shown; findings below may reference images not displayed]

FINDINGS: There is no evidence of fracture or dislocation. There is no
evidence of arthropathy or other focal bone abnormality. Soft
tissues are unremarkable.
IMPRESSION: Negative.

## 2022-07-23 IMAGING — DX DG WRIST COMPLETE 3+V*R*
1 series · 4 of 4 positions shown · non-contrast
Comparison: None.

CLINICAL DATA: Boxing injury.

EXAM:
RIGHT WRIST - COMPLETE 3+ VIEW; RIGHT HAND - COMPLETE 3+ VIEW

[Series 1: wrist · 0.14mm/px · 4 of 4 slices shown]
[im 1/4]
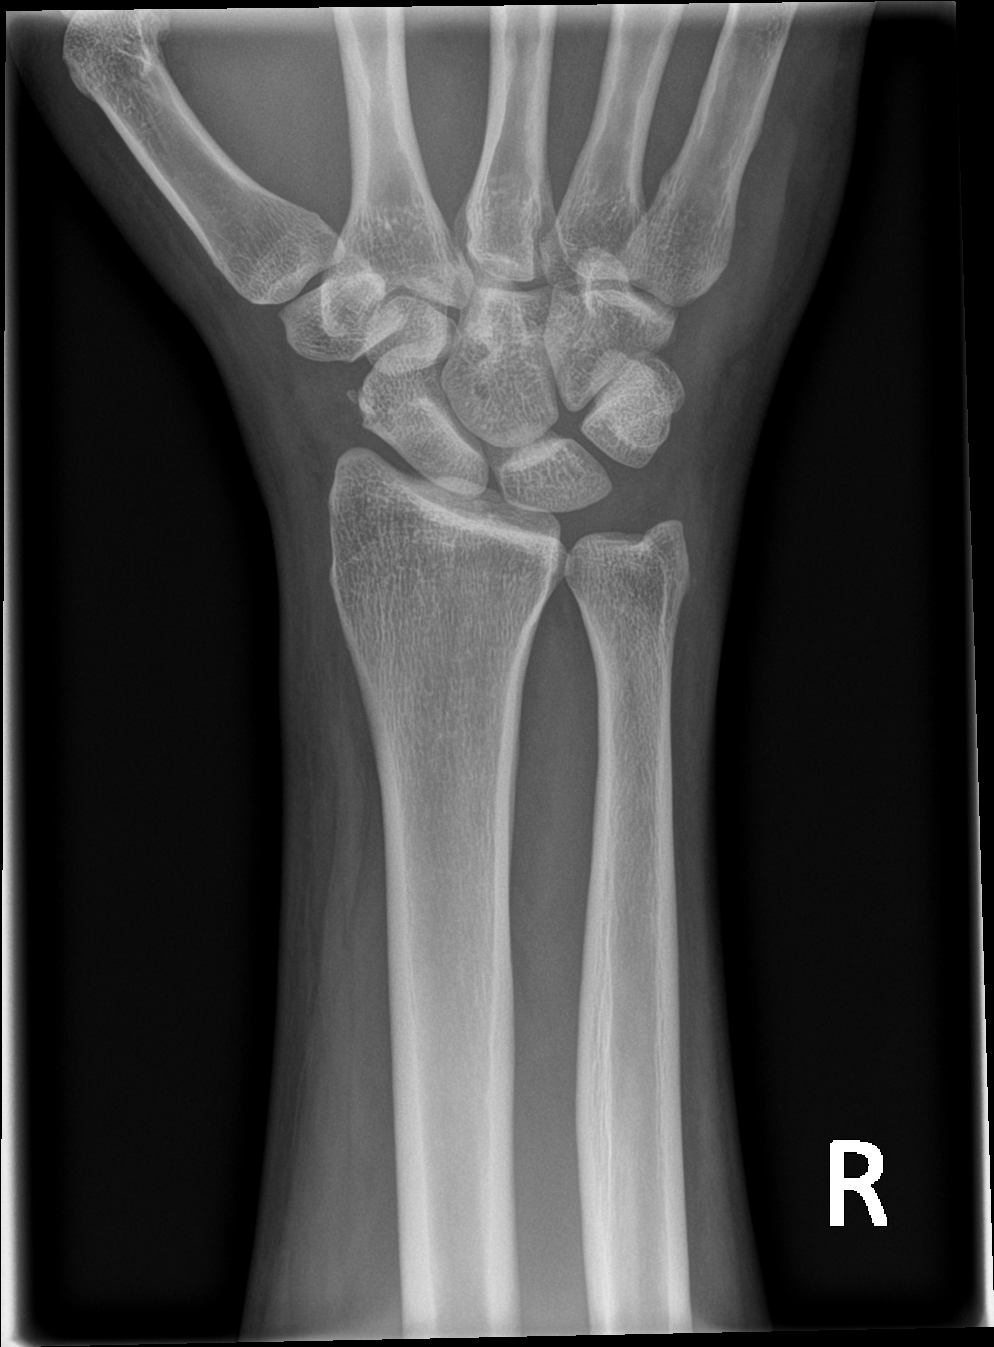
[im 2/4]
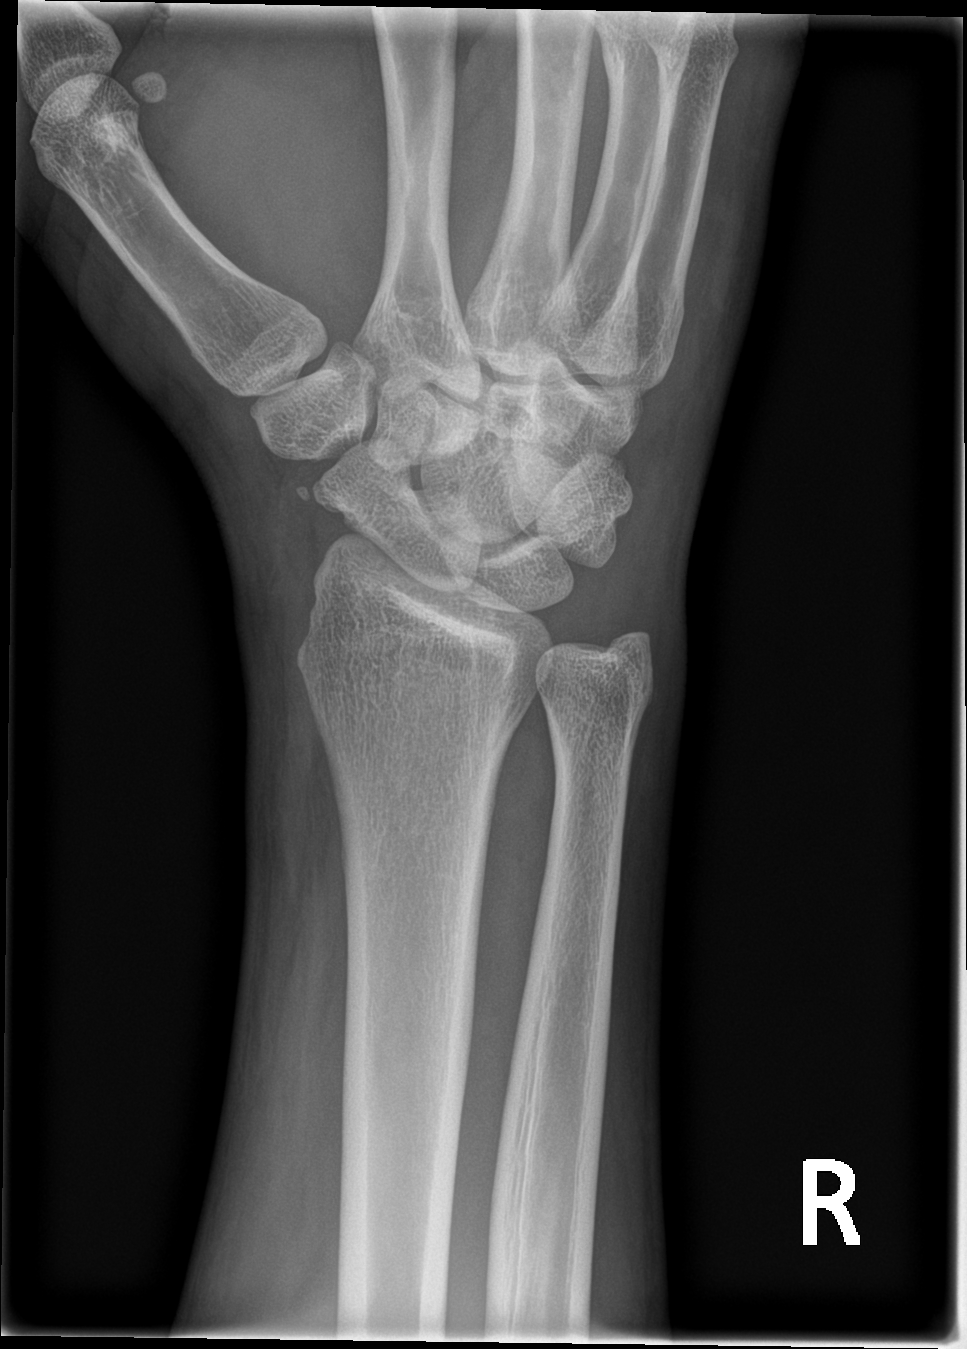
[im 3/4]
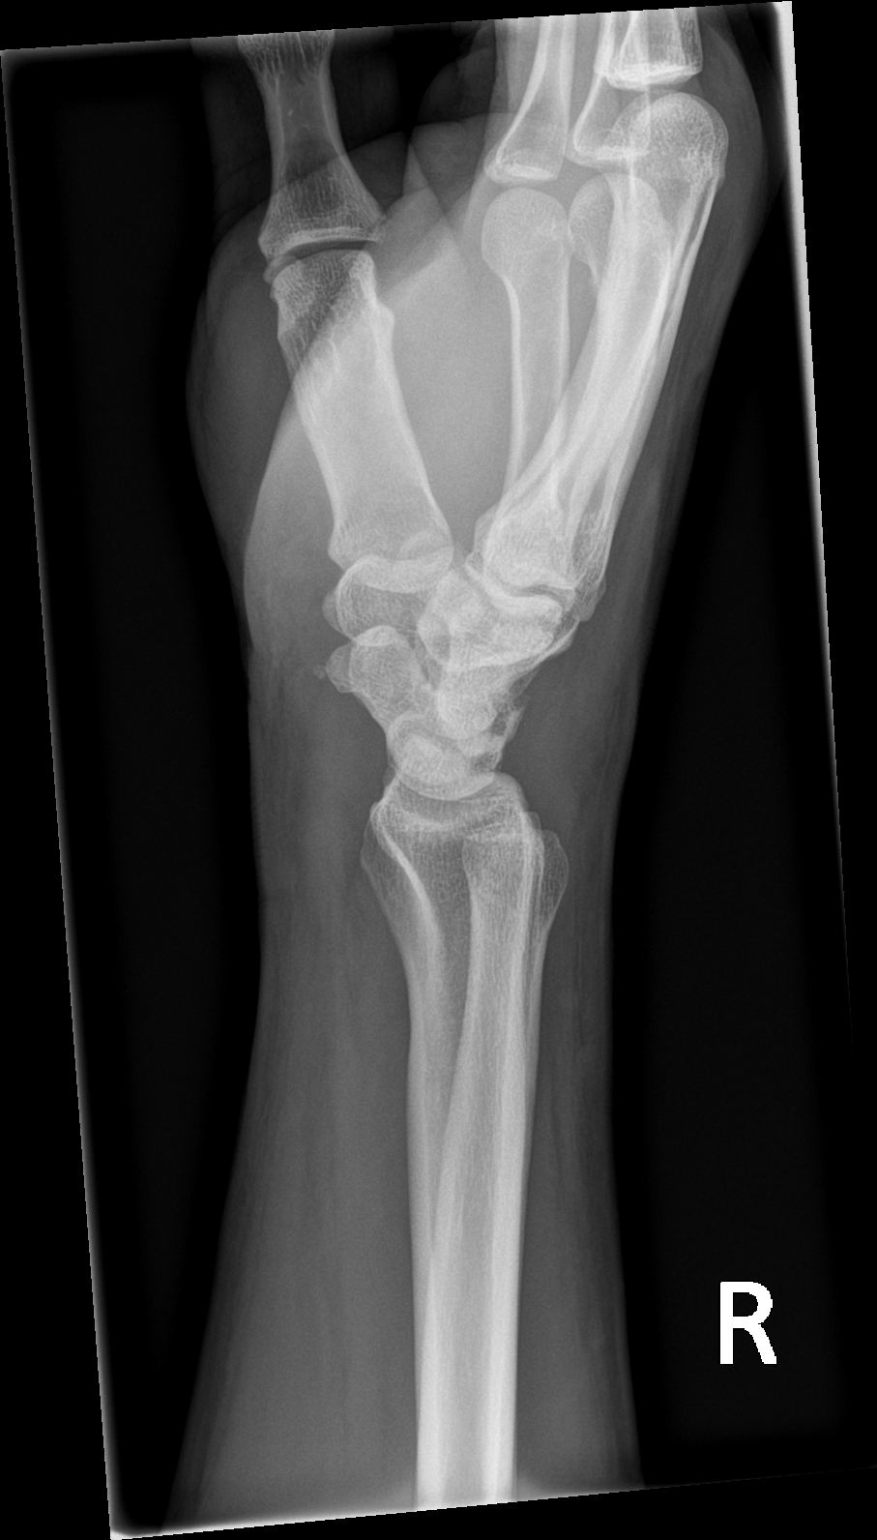
[im 4/4]
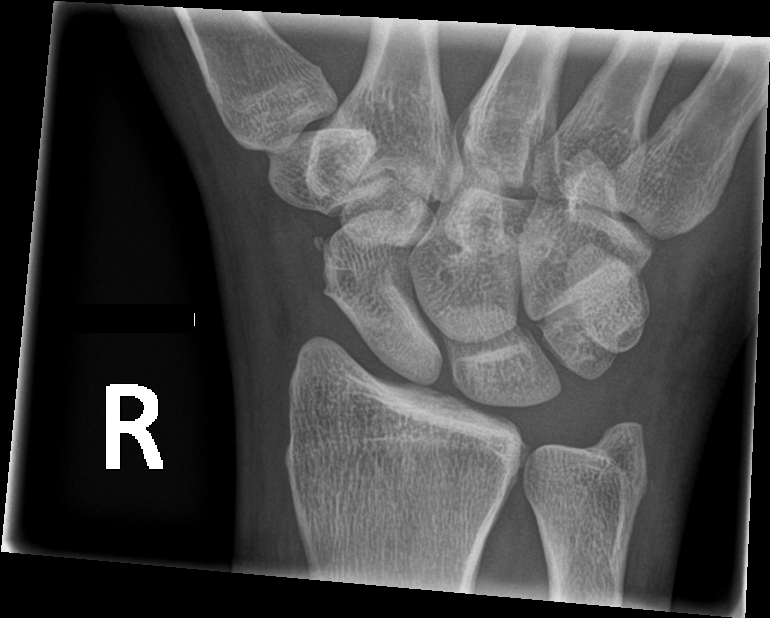

[4 of 4 positions shown; findings below may reference images not displayed]

FINDINGS: There is no evidence of fracture or dislocation. There is no
evidence of arthropathy or other focal bone abnormality. Soft
tissues are unremarkable.
IMPRESSION: Negative.
